# Patient Record
Sex: Female | Born: 2000 | Hispanic: Yes | Marital: Single | Smoking: Former smoker
Health system: Southern US, Community
[De-identification: ages and names within clinical notes are randomized; demographics above are authoritative.]

## PROBLEM LIST (undated history)

## (undated) DIAGNOSIS — Z789 Other specified health status: Secondary | ICD-10-CM

## (undated) DIAGNOSIS — I10 Essential (primary) hypertension: Secondary | ICD-10-CM

## (undated) DIAGNOSIS — O139 Gestational [pregnancy-induced] hypertension without significant proteinuria, unspecified trimester: Secondary | ICD-10-CM

## (undated) HISTORY — DX: Essential (primary) hypertension: I10

## (undated) HISTORY — PX: NO PAST SURGERIES: SHX2092

## (undated) HISTORY — DX: Gestational (pregnancy-induced) hypertension without significant proteinuria, unspecified trimester: O13.9

---
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
## 2020-02-12 ENCOUNTER — Inpatient Hospital Stay (HOSPITAL_COMMUNITY)
Admission: AD | Admit: 2020-02-12 | Discharge: 2020-02-12 | Disposition: A | Discharge status: Home / Self Care | Payer: Medicaid Other | Attending: Obstetrics and Gynecology | Admitting: Obstetrics and Gynecology

## 2020-02-12 ENCOUNTER — Other Ambulatory Visit: Payer: Self-pay

## 2020-02-12 ENCOUNTER — Encounter (HOSPITAL_COMMUNITY): Payer: Self-pay | Admitting: Obstetrics and Gynecology

## 2020-02-12 DIAGNOSIS — O26892 Other specified pregnancy related conditions, second trimester: Secondary | ICD-10-CM | POA: Insufficient documentation

## 2020-02-12 DIAGNOSIS — O209 Hemorrhage in early pregnancy, unspecified: Secondary | ICD-10-CM | POA: Insufficient documentation

## 2020-02-12 DIAGNOSIS — R109 Unspecified abdominal pain: Secondary | ICD-10-CM | POA: Diagnosis not present

## 2020-02-12 DIAGNOSIS — Z79899 Other long term (current) drug therapy: Secondary | ICD-10-CM | POA: Insufficient documentation

## 2020-02-12 DIAGNOSIS — Z87891 Personal history of nicotine dependence: Secondary | ICD-10-CM | POA: Insufficient documentation

## 2020-02-12 DIAGNOSIS — Z3A18 18 weeks gestation of pregnancy: Secondary | ICD-10-CM | POA: Insufficient documentation

## 2020-02-12 DIAGNOSIS — Z791 Long term (current) use of non-steroidal anti-inflammatories (NSAID): Secondary | ICD-10-CM | POA: Insufficient documentation

## 2020-02-12 DIAGNOSIS — O26899 Other specified pregnancy related conditions, unspecified trimester: Secondary | ICD-10-CM

## 2020-02-12 LAB — WET PREP, GENITAL
Clue Cells Wet Prep HPF POC: NONE SEEN
Sperm: NONE SEEN
Trich, Wet Prep: NONE SEEN
Yeast Wet Prep HPF POC: NONE SEEN

## 2020-02-12 LAB — URINALYSIS, ROUTINE W REFLEX MICROSCOPIC
Bilirubin Urine: NEGATIVE
Glucose, UA: NEGATIVE mg/dL
Hgb urine dipstick: NEGATIVE
Ketones, ur: NEGATIVE mg/dL
Leukocytes,Ua: NEGATIVE
Nitrite: NEGATIVE
Protein, ur: NEGATIVE mg/dL
Specific Gravity, Urine: 1.014 (ref 1.005–1.030)
pH: 7 (ref 5.0–8.0)

## 2020-02-12 NOTE — MAU Note (Signed)
Theresa Tran is a 19 y.o. at [redacted]w[redacted]d here in MAU reporting:  +lower abdominal pain Sharp Intermittent Worse at night  +vaginal bleeding Spotting Light pink  Endorses that " I haven't been seen since 10 weeks. I don't know the gender of the baby, whats going on with the baby or anything like that."  Reports that she has had a hard time getting accepted for a prenatal appointment.   Onset of complaint: x1 week Pain score: 4/10 Vitals:   02/12/20 1737  BP: 128/73  Pulse: 94  Resp: 18  Temp: 98.1 F (36.7 C)  SpO2: 99%    FHT: 146 via doppler Lab orders placed from triage: ua

## 2020-02-12 NOTE — MAU Provider Note (Signed)
Chief Complaint: Abdominal Pain and Vaginal Bleeding   First Provider Initiated Contact with Patient 02/12/20 1750    SUBJECTIVE HPI: Theresa Tran is a 19 y.o. G2P1001 at [redacted]w[redacted]d by L/10 who presents to maternity admissions reporting concern of mild abdominal cramping in addition to mild spotting last week. Pt reports pain as intermittent and rated 5/10. Pt reports that pain is more frequent at night. She has not tried any medication for the pain. No medications besides prenatal vitamin. No substances. Prior history uncomplicated except for gHTN. No BP cuff at home.  She states that she is here today given that she has been unable to schedule a f/u prenatal appointment [facility] [address] (she was told no appointments available until 03/2020).  She denies vaginal bleeding, vaginal itching/burning, urinary symptoms, h/a, dizziness, n/v, constipation, diarrhea, or fever/chills.  Past Medical History:  Diagnosis Date  . Medical history non-contributory    History reviewed. No pertinent surgical history. Social History   Socioeconomic History  . Marital status: Single    Spouse name: Not on file  . Number of children: Not on file  . Years of education: Not on file  . Highest education level: Not on file  Occupational History  . Not on file  Tobacco Use  . Smoking status: Former Smoker    Quit date: 07/10/2016    Years since quitting: 3.5  . Smokeless tobacco: Never Used  Substance and Sexual Activity  . Alcohol use: No  . Drug use: No  . Sexual activity: Yes  Other Topics Concern  . Not on file  Social History Narrative  . Not on file   Social Determinants of Health   Financial Resource Strain:   . Difficulty of Paying Living Expenses: Not on file  Food Insecurity: Food Insecurity Present  . Worried About Programme researcher, broadcasting/film/video in the Last Year: Sometimes true  . Ran Out of Food in the Last Year: Sometimes true  Transportation Needs: No  Transportation Needs  . Lack of Transportation (Medical): No  . Lack of Transportation (Non-Medical): No  Physical Activity:   . Days of Exercise per Week: Not on file  . Minutes of Exercise per Session: Not on file  Stress:   . Feeling of Stress : Not on file  Social Connections:   . Frequency of Communication with Friends and Family: Not on file  . Frequency of Social Gatherings with Friends and Family: Not on file  . Attends Religious Services: Not on file  . Active Member of Clubs or Organizations: Not on file  . Attends Banker Meetings: Not on file  . Marital Status: Not on file  Intimate Partner Violence:   . Fear of Current or Ex-Partner: Not on file  . Emotionally Abused: Not on file  . Physically Abused: Not on file  . Sexually Abused: Not on file   No current facility-administered medications on file prior to encounter.   Current Outpatient Medications on File Prior to Encounter  Medication Sig Dispense Refill  . Prenatal Vit-Fe Fumarate-FA (PRENATAL MULTIVITAMIN) TABS tablet Take 1 tablet by mouth daily at 12 noon.     No Known Allergies  ROS:  Review of Systems  Constitutional: Negative for appetite change, chills, fatigue and fever.  HENT: Negative for congestion and sore throat.   Eyes: Negative for photophobia and visual disturbance.  Respiratory: Negative for cough, chest tightness and shortness of breath.   Cardiovascular: Negative for chest  pain.  Gastrointestinal: Negative for abdominal distention, abdominal pain, constipation, diarrhea, nausea and vomiting.       Intermittent abdominal cramping  Endocrine: Negative for polyuria.  Genitourinary: Negative for decreased urine volume, difficulty urinating, dyspareunia, dysuria, flank pain, frequency, vaginal bleeding, vaginal discharge and vaginal pain.  Musculoskeletal: Negative for arthralgias and back pain.  Neurological: Negative for dizziness, syncope, weakness and light-headedness.    I  have reviewed patient's Past Medical Hx, Surgical Hx, Family Hx, Social Hx, medications and allergies.   Physical Exam   Patient Vitals for the past 24 hrs:  BP Temp Temp src Pulse Resp SpO2  02/12/20 1920 118/60 -- -- 76 18 --  02/12/20 1737 128/73 98.1 F (36.7 C) Oral 94 18 99 %   Constitutional: Well-developed, well-nourished female in no acute distress. Lying comfortably on stretcher. Cardiovascular: RRR, no murmurs, rubs or gallops. Respiratory: normal effort. CTAB in anterior lung fields. GI: Abd soft, non-tender to palpation. Pos BS x 4. No CVA tenderness. MS: Extremities nontender, no edema, normal ROM Neurologic: Alert and oriented x 4.  GU: deferred  FHT 146 by doppler  LAB RESULTS Results for orders placed or performed during the hospital encounter of 02/12/20 (from the past 24 hour(s))  Urinalysis, Routine w reflex microscopic Urine, Clean Catch     Status: Abnormal   Collection Time: 02/12/20  5:57 PM  Result Value Ref Range   Color, Urine YELLOW YELLOW   APPearance CLOUDY (A) CLEAR   Specific Gravity, Urine 1.014 1.005 - 1.030   pH 7.0 5.0 - 8.0   Glucose, UA NEGATIVE NEGATIVE mg/dL   Hgb urine dipstick NEGATIVE NEGATIVE   Bilirubin Urine NEGATIVE NEGATIVE   Ketones, ur NEGATIVE NEGATIVE mg/dL   Protein, ur NEGATIVE NEGATIVE mg/dL   Nitrite NEGATIVE NEGATIVE   Leukocytes,Ua NEGATIVE NEGATIVE  Wet prep, genital     Status: Abnormal   Collection Time: 02/12/20  6:55 PM  Result Value Ref Range   Yeast Wet Prep HPF POC NONE SEEN NONE SEEN   Trich, Wet Prep NONE SEEN NONE SEEN   Clue Cells Wet Prep HPF POC NONE SEEN NONE SEEN   WBC, Wet Prep HPF POC FEW (A) NONE SEEN   Sperm NONE SEEN     IMAGING No results found.  MAU Management/MDM: Orders Placed This Encounter  Procedures  . Wet prep, genital  . [place] MFM OB COMP + 14 WK  . Urinalysis, Routine w reflex microscopic Urine, Clean Catch  . Discharge patient Discharge disposition: 01-Home or Self Care;  Discharge patient date: 02/12/2020 Pt may discharge s/p self collect of wet prep & GC/C (pt prefers not to wait for wet prep result & Adelyna Brockman will call pt if abnormal).    No orders of the defined types were placed in this encounter.   Treatments in MAU included: none. Pt discharged with strict precautions to call prior to next prenatal appointment for vaginal bleeding, severe abdominal pain, loss of fluid from the vagina, contractions and other concerns.  ASSESSMENT 1. Abdominal cramping affecting pregnancy: Pt is an 19 year old G2P1001 at [redacted]w[redacted]d based on L/10 who presents given concern of intermittent abdominal cramping for 1 week in addition to minimal pink spotting 1 week ago. Ultimately she presents to MAU today given inability to schedule a prenatal appt at [facility] [address]. Reassuringly, pt has a confirmed IUP based on [place] in MAU at [redacted]w[redacted]d. Today, no concerning symptoms and reassuring exam. UA and wet prep unremarkable (GC/C pending)  with +FHTs. -provided reassurance given exam and lab findings today -emphasized need for continued daily prenatal vitamin + ASA given h/o gHTN -sent message to clinic admin pool to schedule pt for prompt prenatal f/u appt -placed future order for anatomy scan to be performed in clinic -provided strict return precautions for signs of preterm labor as noted above   PLAN Discharge home Allergies as of 02/12/2020   No Known Allergies     Medication List    STOP taking these medications   amLODipine 5 MG tablet Commonly known as: Norvasc   ibuprofen 600 MG tablet Commonly known as: ADVIL     TAKE these medications   prenatal multivitamin Tabs tablet Take 1 tablet by mouth daily at 12 noon.      Lynnda Shields, MD OB Fellow, Faculty Practice 02/12/2020  9:38 PM

## 2020-02-12 NOTE — Discharge Instructions (Signed)
You were seen today given concern of abdominal cramping in pregnancy. No sign of infection in your urine. Given your ultrasound with confirmed intrauterine pregnancy at [redacted] weeks gestational age, we are not concerned of an ectopic pregnancy at this time.  We have sent a message to the [facility] on [address] to schedule your initial prenatal appointment. I have also placed an order for your anatomy scan ultrasound--you should receive a call to schedule this appointment.  Please call sooner if concern of vaginal bleeding, severe abdominal pain or other concerns.   Abdominal Pain During Pregnancy  Abdominal pain is common during pregnancy, and has many possible causes. Some causes are more serious than others, and sometimes the cause is not known. Abdominal pain can be a sign that labor is starting. It can also be caused by normal growth and stretching of muscles and ligaments during pregnancy. Always tell your health care provider if you have any abdominal pain. Follow these instructions at home:  Do not have sex or put anything in your vagina until your pain goes away completely.  Get plenty of rest until your pain improves.  Drink enough fluid to keep your urine pale yellow.  Take over-the-counter and prescription medicines only as told by your health care provider.  Keep all follow-up visits as told by your health care provider. This is important. Contact a health care provider if:  Your pain continues or gets worse after resting.  You have lower abdominal pain that: ? Comes and goes at regular intervals. ? Spreads to your back. ? Is similar to menstrual cramps.  You have pain or burning when you urinate. Get help right away if:  You have a fever or chills.  You have vaginal bleeding.  You are leaking fluid from your vagina.  You are passing tissue from your vagina.  You have vomiting or diarrhea that lasts for more than 24 hours.  Your baby is moving less  than usual.  You feel very weak or faint.  You have shortness of breath.  You develop severe pain in your upper abdomen. Summary  Abdominal pain is common during pregnancy, and has many possible causes.  If you experience abdominal pain during pregnancy, tell your health care provider right away.  Follow your health care provider's home care instructions and keep all follow-up visits as directed. This information is not intended to replace advice given to you by your health care provider. Make sure you discuss any questions you have with your health care provider. Document Revised: 09/13/2018 Document Reviewed: 08/28/2016 Elsevier Patient Education  2020 [employer].

## 2020-02-14 LAB — GC/CHLAMYDIA PROBE AMP (~~LOC~~) NOT AT ARMC
Chlamydia: NEGATIVE
Comment: NEGATIVE
Comment: NORMAL
Neisseria Gonorrhea: NEGATIVE

## 2020-02-16 ENCOUNTER — Telehealth: Payer: Self-pay | Admitting: Family Medicine

## 2020-02-16 NOTE — Telephone Encounter (Signed)
Attempted to contact patient to get her scheduled for prenatal care. A gentleman answered the phone and stated that he is currently at work and he will let the patient know to call the office back.

## 2020-03-06 ENCOUNTER — Encounter: Payer: Self-pay | Admitting: Nurse Practitioner

## 2020-03-06 ENCOUNTER — Other Ambulatory Visit: Payer: Self-pay

## 2020-03-06 ENCOUNTER — Ambulatory Visit (INDEPENDENT_AMBULATORY_CARE_PROVIDER_SITE_OTHER): Payer: Medicaid Other | Admitting: Nurse Practitioner

## 2020-03-06 ENCOUNTER — Encounter: Payer: Self-pay | Admitting: Family Medicine

## 2020-03-06 VITALS — BP 116/70 | HR 82 | Wt 259.8 lb

## 2020-03-06 DIAGNOSIS — Z3A22 22 weeks gestation of pregnancy: Secondary | ICD-10-CM

## 2020-03-06 DIAGNOSIS — O0990 Supervision of high risk pregnancy, unspecified, unspecified trimester: Secondary | ICD-10-CM

## 2020-03-06 DIAGNOSIS — Z6841 Body Mass Index (BMI) 40.0 and over, adult: Secondary | ICD-10-CM

## 2020-03-06 DIAGNOSIS — Z8759 Personal history of other complications of pregnancy, childbirth and the puerperium: Secondary | ICD-10-CM

## 2020-03-06 HISTORY — DX: Supervision of high risk pregnancy, unspecified, unspecified trimester: O09.90

## 2020-03-06 NOTE — Progress Notes (Unsigned)
Medicaid Home Form Completed-03/06/20

## 2020-03-06 NOTE — Progress Notes (Signed)
Subjective:   Theresa Tran is a 19 y.o. G2P1001 at [redacted]w[redacted]d by early ultrasound being seen today for her first obstetrical visit.  Her obstetrical history is significant for hypertension with previous pregnancy. Patient does intend to breast feed. Pregnancy history fully reviewed.  Patient reports no complaints.  HISTORY: OB History  Gravida Para Term Preterm AB Living  2 1 1  0 0 1  SAB TAB Ectopic Multiple Live Births  0 0 0 0 1    # Outcome Date GA Lbr Len/2nd Weight Sex Delivery Anes PTL Lv  2 Current           1 Term 07/19/17 [redacted]w[redacted]d 12:19 / 02:01 8 lb 5.7 oz (3.79 kg) F Vag-Spont EPI  LIV     Name: MACIAS-VELEZ,GIRL Emmali     Apgar1: 8  Apgar5: 9   Past Medical History:  Diagnosis Date  . Hypertension    Past Surgical History:  Procedure Laterality Date  . NO PAST SURGERIES     History reviewed. No pertinent family history. Social History   Tobacco Use  . Smoking status: Former Smoker    Quit date: 07/10/2016    Years since quitting: 3.6  . Smokeless tobacco: Never Used  Substance Use Topics  . Alcohol use: No  . Drug use: No   No Known Allergies Current Outpatient Medications on File Prior to Visit  Medication Sig Dispense Refill  . Prenatal Vit-Fe Fumarate-FA (PRENATAL MULTIVITAMIN) TABS tablet Take 1 tablet by mouth daily at 12 noon.     No current facility-administered medications on file prior to visit.     Exam   Vitals:   03/06/20 1024  BP: 116/70  Pulse: 82  Weight: 259 lb 12.8 oz (117.8 kg)   Fetal Heart Rate (bpm): 150  Uterus:  Fundal Height: 23 cm  Pelvic Exam: Perineum: no hemorrhoids, normal perineum   Vulva: normal external genitalia, no lesions   Vagina:  normal mucosa, normal discharge   Cervix: no lesions and normal, pap smear done.    Adnexa: normal adnexa and no mass, fullness, tenderness   Bony Pelvis: average  System: General: well-developed, well-nourished female in no acute distress   Breast:  normal appearance,  no masses or tenderness   Skin: normal coloration and turgor, no rashes   Neurologic: oriented, normal, negative, normal mood   Extremities: normal strength, tone, and muscle mass, ROM of all joints is normal   HEENT extraocular movement intact and sclera clear, anicteric   Mouth/Teeth deferred   Neck supple and no masses, normal thyroid   Cardiovascular: regular rate and rhythm   Respiratory:  no respiratory distress, normal breath sounds   Abdomen: soft, non-tender; no masses,  no organomegaly     Assessment:   Pregnancy: G2P1001 Patient Active Problem List   Diagnosis Date Noted  . Supervision of high risk pregnancy, antepartum 03/06/2020  . High risk teen pregnancy 07/19/2017  . History of gestational hypertension 07/18/2017     Plan:  1. Supervision of high risk pregnancy, antepartum Doing well. To see pregnancy navigator today Has 19 year old accompanying her today. Anatomy 2 ordered previously - will send note to admin staff to schedule  - CBC/D/Plt+RPR+Rh+ABO+Rub Ab... - CHL AMB BABYSCRIPTS OPT IN - Comprehensive metabolic panel - Culture, OB Urine - Protein / creatinine ratio, urine - TSH - Hemoglobin A1c - AFP, Serum, Open Spina Bifida - Genetic Screening  2. History of gestational hypertension Baseline labs done today.  3. [redacted]  weeks gestation of pregnancy  4. BMI 40.0-44.9, adult (HCC) Advised weight gain of 20 pounds in this pregnancy Drink at least 8 8-oz glasses of water every day. Stop juice, soda and sweet tea as they can cause extra weight gain.   Initial labs drawn. Continue prenatal vitamins. Genetic Screening discussed, AFP and NIPS: ordered. Ultrasound discussed; fetal anatomic survey: already ordered - will schedule. Problem list reviewed and updated. The nature of [place] [facility] with multiple MDs and other Advanced Practice Providers was explained to patient; also emphasized that residents, students are part  of our team. Routine obstetric precautions reviewed. Return in about 4 weeks (around 04/03/2020) for in person ROB.  Total face-to-face time with patient: 40 minutes.  Over 50% of encounter was spent on counseling and coordination of care.     Nolene Bernheim, FNP Family Nurse Practitioner, [facility] for [employer], [facility] 03/06/2020 2:24 PM

## 2020-03-07 LAB — COMPREHENSIVE METABOLIC PANEL
ALT: 18 IU/L (ref 0–32)
AST: 11 IU/L (ref 0–40)
Albumin/Globulin Ratio: 1.5 (ref 1.2–2.2)
Albumin: 4 g/dL (ref 3.9–5.0)
Alkaline Phosphatase: 119 IU/L — ABNORMAL HIGH (ref 42–106)
BUN/Creatinine Ratio: 11 (ref 9–23)
BUN: 6 mg/dL (ref 6–20)
Bilirubin Total: 0.3 mg/dL (ref 0.0–1.2)
CO2: 20 mmol/L (ref 20–29)
Calcium: 9.2 mg/dL (ref 8.7–10.2)
Chloride: 103 mmol/L (ref 96–106)
Creatinine, Ser: 0.54 mg/dL — ABNORMAL LOW (ref 0.57–1.00)
GFR calc Af Amer: 159 mL/min/{1.73_m2} (ref >59)
GFR calc non Af Amer: 138 mL/min/{1.73_m2} (ref >59)
Globulin, Total: 2.6 g/dL (ref 1.5–4.5)
Glucose: 75 mg/dL (ref 65–99)
Potassium: 4.2 mmol/L (ref 3.5–5.2)
Sodium: 137 mmol/L (ref 134–144)
Total Protein: 6.6 g/dL (ref 6.0–8.5)

## 2020-03-07 LAB — CBC/D/PLT+RPR+RH+ABO+RUB AB...
Antibody Screen: NEGATIVE
Basophils Absolute: 0.1 10*3/uL (ref 0.0–0.2)
Basos: 1 %
EOS (ABSOLUTE): 0.7 10*3/uL — ABNORMAL HIGH (ref 0.0–0.4)
Eos: 6 %
HCV Ab: <0.1 s/co ratio (ref 0.0–0.9)
HIV Screen 4th Generation wRfx: NONREACTIVE
Hematocrit: 38.1 % (ref 34.0–46.6)
Hemoglobin: 12.6 g/dL (ref 11.1–15.9)
Hepatitis B Surface Ag: NEGATIVE
Immature Grans (Abs): 0.1 10*3/uL (ref 0.0–0.1)
Immature Granulocytes: 1 %
Lymphocytes Absolute: 2.1 10*3/uL (ref 0.7–3.1)
Lymphs: 20 %
MCH: 28.4 pg (ref 26.6–33.0)
MCHC: 33.1 g/dL (ref 31.5–35.7)
MCV: 86 fL (ref 79–97)
Monocytes Absolute: 0.4 10*3/uL (ref 0.1–0.9)
Monocytes: 4 %
Neutrophils Absolute: 7 10*3/uL (ref 1.4–7.0)
Neutrophils: 68 %
Platelets: 302 10*3/uL (ref 150–450)
RBC: 4.44 x10E6/uL (ref 3.77–5.28)
RDW: 14 % (ref 11.7–15.4)
RPR Ser Ql: NONREACTIVE
Rh Factor: POSITIVE
Rubella Antibodies, IGG: 1.37 index (ref >0.99)
WBC: 10.3 10*3/uL (ref 3.4–10.8)

## 2020-03-07 LAB — AFP, SERUM, OPEN SPINA BIFIDA
AFP MoM: 0.71
AFP Value: 38.6 ng/mL
Gest. Age on Collection Date: 22 weeks
Maternal Age At EDD: 19.3 yr
OSBR Risk 1 IN: 10000
Test Results:: NEGATIVE
Weight: 259 [lb_av]

## 2020-03-07 LAB — TSH: TSH: 1.78 u[IU]/mL (ref 0.450–4.500)

## 2020-03-07 LAB — HEMOGLOBIN A1C
Est. average glucose Bld gHb Est-mCnc: 100 mg/dL
Hgb A1c MFr Bld: 5.1 % (ref 4.8–5.6)

## 2020-03-07 LAB — PROTEIN / CREATININE RATIO, URINE
Creatinine, Urine: 58.8 mg/dL
Protein, Ur: 4.7 mg/dL
Protein/Creat Ratio: 80 mg/g creat (ref 0–200)

## 2020-03-07 LAB — HCV INTERPRETATION

## 2020-03-08 LAB — URINE CULTURE, OB REFLEX

## 2020-03-08 LAB — CULTURE, OB URINE

## 2020-03-16 ENCOUNTER — Other Ambulatory Visit (HOSPITAL_COMMUNITY): Payer: Self-pay | Admitting: Obstetrics and Gynecology

## 2020-03-16 ENCOUNTER — Other Ambulatory Visit: Payer: Self-pay

## 2020-03-16 ENCOUNTER — Ambulatory Visit: Payer: Medicaid Other | Attending: Obstetrics and Gynecology

## 2020-03-16 DIAGNOSIS — O26899 Other specified pregnancy related conditions, unspecified trimester: Secondary | ICD-10-CM | POA: Diagnosis present

## 2020-03-16 DIAGNOSIS — O99212 Obesity complicating pregnancy, second trimester: Secondary | ICD-10-CM

## 2020-03-16 DIAGNOSIS — R109 Unspecified abdominal pain: Secondary | ICD-10-CM | POA: Insufficient documentation

## 2020-03-16 DIAGNOSIS — Z3A23 23 weeks gestation of pregnancy: Secondary | ICD-10-CM

## 2020-03-16 DIAGNOSIS — E669 Obesity, unspecified: Secondary | ICD-10-CM | POA: Diagnosis not present

## 2020-03-16 DIAGNOSIS — Z363 Encounter for antenatal screening for malformations: Secondary | ICD-10-CM | POA: Diagnosis not present

## 2020-03-19 ENCOUNTER — Other Ambulatory Visit: Payer: Self-pay | Admitting: *Deleted

## 2020-03-19 DIAGNOSIS — Z362 Encounter for other antenatal screening follow-up: Secondary | ICD-10-CM

## 2020-03-25 ENCOUNTER — Other Ambulatory Visit: Payer: Self-pay

## 2020-03-25 DIAGNOSIS — Z148 Genetic carrier of other disease: Secondary | ICD-10-CM

## 2020-03-25 DIAGNOSIS — O0990 Supervision of high risk pregnancy, unspecified, unspecified trimester: Secondary | ICD-10-CM

## 2020-03-26 ENCOUNTER — Telehealth: Payer: Self-pay

## 2020-03-26 NOTE — Telephone Encounter (Signed)
Called Pt to see if Theresa Tran had reached out to her regarding Horizon Test results & she said Yes but still was not quit sure what it is. So I explained that she carries a gene called Galactosemia & that affects how the body breaks down a sugar called Galactose. Pt verbalized understanding.

## 2020-04-03 ENCOUNTER — Ambulatory Visit (INDEPENDENT_AMBULATORY_CARE_PROVIDER_SITE_OTHER): Payer: Medicaid Other | Admitting: Student

## 2020-04-03 ENCOUNTER — Other Ambulatory Visit: Payer: Self-pay

## 2020-04-03 VITALS — BP 119/69 | HR 87 | Wt 264.0 lb

## 2020-04-03 DIAGNOSIS — Z3A26 26 weeks gestation of pregnancy: Secondary | ICD-10-CM

## 2020-04-03 DIAGNOSIS — O0990 Supervision of high risk pregnancy, unspecified, unspecified trimester: Secondary | ICD-10-CM

## 2020-04-03 NOTE — Patient Instructions (Addendum)
[address] [place], [place], [address]. Phone. [phone].  Blood pressure warning levels are anytime the levels are higher than 140/90. If two high blood pressure readings, come to the maternity assessment unit.

## 2020-04-03 NOTE — Progress Notes (Signed)
   PRENATAL VISIT NOTE  Subjective:  Theresa Tran is a 19 y.o. G2P1001 at [redacted]w[redacted]d being seen today for ongoing prenatal care.  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  Patient reports no complaints.  Her partner has not been tested, although he "wants to". She has not picked up her BP cuff.  Contractions: Not present. Vag. Bleeding: None.  Movement: Present. Denies leaking of fluid.   The following portions of the patient's history were reviewed and updated as appropriate: allergies, current medications, past family history, past medical history, past social history, past surgical history and problem list.   Objective:   Vitals:   04/03/20 1016  BP: 119/69  Pulse: 87  Weight: 264 lb (119.7 kg)    Fetal Status: Fetal Heart Rate (bpm): 152 Fundal Height: 26 cm Movement: Present     General:  Alert, oriented and cooperative. Patient is in no acute distress.  Skin: Skin is warm and dry. No rash noted.   Cardiovascular: Normal heart rate noted  Respiratory: Normal respiratory effort, no problems with respiration noted  Abdomen: Soft, gravid, appropriate for gestational age.  Pain/Pressure: Absent     Pelvic: Cervical exam deferred        Extremities: Normal range of motion.  Edema: None  Mental Status: Normal mood and affect. Normal behavior. Normal judgment and thought content.   Assessment and Plan:  Pregnancy: G2P1001 at [redacted]w[redacted]d 1. Supervision of high risk pregnancy, antepartum -SHe will call to have [employer] kit sent to house for genetic testing -She does not want IUD, wants to think about it, maybe Nexplanon. She says "I know I need to get on something".  -Keep follow up US for growth -Anticipatory guidance given for next OB appt for GTT -Patient will  go and get BP cuff, address given for [place].   Preterm labor symptoms and general obstetric precautions including but not limited to vaginal bleeding, contractions, leaking of fluid and fetal movement were reviewed in detail with the patient. Please refer to After Visit Summary for other counseling recommendations.   Return in about 2 weeks (around 04/17/2020), or LROB needs 2 hour gtt.  Future Appointments  Date Time Provider [place]  04/13/2020  2:30 PM [facility] NURSE [facility]  04/13/2020  2:45 PM [facility] US5 [facility]  04/18/2020  8:20 AM [facility] LAB [facility]  04/18/2020  9:15 AM Ardean Larsen, Mervyn Skeeters, CNM [facility]    [place], CNM

## 2020-04-11 ENCOUNTER — Encounter: Payer: Self-pay | Admitting: *Deleted

## 2020-04-13 ENCOUNTER — Ambulatory Visit: Payer: Medicaid Other | Attending: Obstetrics and Gynecology

## 2020-04-13 ENCOUNTER — Other Ambulatory Visit: Payer: Self-pay

## 2020-04-13 ENCOUNTER — Ambulatory Visit: Payer: Medicaid Other | Admitting: *Deleted

## 2020-04-13 ENCOUNTER — Encounter: Payer: Self-pay | Admitting: *Deleted

## 2020-04-13 DIAGNOSIS — O99212 Obesity complicating pregnancy, second trimester: Secondary | ICD-10-CM

## 2020-04-13 DIAGNOSIS — Z148 Genetic carrier of other disease: Secondary | ICD-10-CM

## 2020-04-13 DIAGNOSIS — O0990 Supervision of high risk pregnancy, unspecified, unspecified trimester: Secondary | ICD-10-CM | POA: Insufficient documentation

## 2020-04-13 DIAGNOSIS — E669 Obesity, unspecified: Secondary | ICD-10-CM

## 2020-04-13 DIAGNOSIS — Z362 Encounter for other antenatal screening follow-up: Secondary | ICD-10-CM | POA: Insufficient documentation

## 2020-04-13 DIAGNOSIS — Z3A27 27 weeks gestation of pregnancy: Secondary | ICD-10-CM

## 2020-04-13 DIAGNOSIS — O321XX0 Maternal care for breech presentation, not applicable or unspecified: Secondary | ICD-10-CM

## 2020-04-17 ENCOUNTER — Other Ambulatory Visit: Payer: Self-pay

## 2020-04-17 DIAGNOSIS — O0990 Supervision of high risk pregnancy, unspecified, unspecified trimester: Secondary | ICD-10-CM

## 2020-04-18 ENCOUNTER — Other Ambulatory Visit: Payer: Self-pay

## 2020-04-18 ENCOUNTER — Other Ambulatory Visit: Payer: Medicaid Other

## 2020-04-18 ENCOUNTER — Other Ambulatory Visit: Payer: Self-pay | Admitting: *Deleted

## 2020-04-18 ENCOUNTER — Ambulatory Visit (INDEPENDENT_AMBULATORY_CARE_PROVIDER_SITE_OTHER): Payer: Medicaid Other | Admitting: Student

## 2020-04-18 VITALS — BP 107/66 | HR 77 | Wt 263.2 lb

## 2020-04-18 DIAGNOSIS — O0992 Supervision of high risk pregnancy, unspecified, second trimester: Secondary | ICD-10-CM

## 2020-04-18 DIAGNOSIS — Z23 Encounter for immunization: Secondary | ICD-10-CM | POA: Diagnosis not present

## 2020-04-18 DIAGNOSIS — Z3A28 28 weeks gestation of pregnancy: Secondary | ICD-10-CM

## 2020-04-18 DIAGNOSIS — R638 Other symptoms and signs concerning food and fluid intake: Secondary | ICD-10-CM

## 2020-04-18 DIAGNOSIS — O0990 Supervision of high risk pregnancy, unspecified, unspecified trimester: Secondary | ICD-10-CM

## 2020-04-18 MED ORDER — BLOOD PRESSURE KIT DEVI: 1.0000 | Freq: Once | 0 refills | Status: AC

## 2020-04-18 NOTE — Progress Notes (Signed)
   PRENATAL VISIT NOTE  Subjective:  Theresa Tran is a 19 y.o. G2P1001 at [redacted]w[redacted]d being seen today for ongoing prenatal care.  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  Patient reports no complaints.  Contractions: Not present. Vag. Bleeding: None.  Movement: Present. Denies leaking of fluid.   The following portions of the patient's history were reviewed and updated as appropriate: allergies, current medications, past family history, past medical history, past social history, past surgical history and problem list.   Objective:   Vitals:   04/18/20 0828  BP: 107/66  Pulse: 77  Weight: 263 lb 3.2 oz (119.4 kg)    Fetal Status: Fetal Heart Rate (bpm): 132 Fundal Height: 31 cm Movement: Present     General:  Alert, oriented and cooperative. Patient is in no acute distress.  Skin: Skin is warm and dry. No rash noted.   Cardiovascular: Normal heart rate noted  Respiratory: Normal respiratory effort, no problems with respiration noted  Abdomen: Soft, gravid, appropriate for gestational age.  Pain/Pressure: Absent     Pelvic: Cervical exam deferred        Extremities: Normal range of motion.  Edema: None  Mental Status: Normal mood and affect. Normal behavior. Normal judgment and thought content.   Assessment and Plan:  Pregnancy: G2P1001 at [redacted]w[redacted]d 1. Supervision of high risk pregnancy, antepartum -Will go and get BP cuff; she forgot to pick up last time.  -She will call and get the kit sent for partner testing -She had normal growth scan and needs 4 week scan; she forgot to schedule it last time and will go up to MFM now to schedule it.   -2 hour in process - Blood Pressure Monitoring (BLOOD PRESSURE KIT) DEVI; 1 Device by Does not apply route once for 1  dose.  Dispense: 1 each; Refill: 0 - Tdap vaccine greater than or equal to 7yo IM  Preterm labor symptoms and general obstetric precautions including but not limited to vaginal bleeding, contractions, leaking of fluid and fetal movement were reviewed in detail with the patient. Please refer to After Visit Summary for other counseling recommendations.   Return in about 2 weeks (around 05/02/2020), or 2 weeks for inperson.  Future Appointments  Date Time Provider [place]  05/07/2020  8:30 AM [facility] US3 [facility] [place], CNM

## 2020-04-18 NOTE — Progress Notes (Deleted)
t

## 2020-04-19 LAB — CBC
Hematocrit: 38.8 % (ref 34.0–46.6)
Hemoglobin: 12.6 g/dL (ref 11.1–15.9)
MCH: 28.1 pg (ref 26.6–33.0)
MCHC: 32.5 g/dL (ref 31.5–35.7)
MCV: 86 fL (ref 79–97)
Platelets: 302 10*3/uL (ref 150–450)
RBC: 4.49 x10E6/uL (ref 3.77–5.28)
RDW: 12.8 % (ref 11.7–15.4)
WBC: 10 10*3/uL (ref 3.4–10.8)

## 2020-04-19 LAB — HIV ANTIBODY (ROUTINE TESTING W REFLEX): HIV Screen 4th Generation wRfx: NONREACTIVE

## 2020-04-19 LAB — RPR: RPR Ser Ql: NONREACTIVE

## 2020-04-19 LAB — GLUCOSE TOLERANCE, 2 HOURS W/ 1HR
Glucose, 1 hour: 140 mg/dL (ref 65–179)
Glucose, 2 hour: 112 mg/dL (ref 65–152)
Glucose, Fasting: 77 mg/dL (ref 65–91)

## 2020-05-02 ENCOUNTER — Encounter: Payer: Medicaid Other | Admitting: Family Medicine

## 2020-05-07 ENCOUNTER — Other Ambulatory Visit: Payer: Self-pay | Admitting: *Deleted

## 2020-05-07 ENCOUNTER — Ambulatory Visit: Payer: Medicaid Other | Admitting: *Deleted

## 2020-05-07 ENCOUNTER — Ambulatory Visit: Payer: Medicaid Other | Attending: Obstetrics and Gynecology

## 2020-05-07 ENCOUNTER — Encounter: Payer: Medicaid Other | Admitting: Advanced Practice Midwife

## 2020-05-07 ENCOUNTER — Encounter: Payer: Self-pay | Admitting: *Deleted

## 2020-05-07 ENCOUNTER — Other Ambulatory Visit: Payer: Self-pay

## 2020-05-07 DIAGNOSIS — R638 Other symptoms and signs concerning food and fluid intake: Secondary | ICD-10-CM | POA: Diagnosis not present

## 2020-05-07 DIAGNOSIS — Z362 Encounter for other antenatal screening follow-up: Secondary | ICD-10-CM

## 2020-05-07 DIAGNOSIS — E669 Obesity, unspecified: Secondary | ICD-10-CM

## 2020-05-07 DIAGNOSIS — O99213 Obesity complicating pregnancy, third trimester: Secondary | ICD-10-CM | POA: Diagnosis not present

## 2020-05-07 DIAGNOSIS — O0990 Supervision of high risk pregnancy, unspecified, unspecified trimester: Secondary | ICD-10-CM | POA: Diagnosis present

## 2020-05-07 DIAGNOSIS — Z148 Genetic carrier of other disease: Secondary | ICD-10-CM | POA: Diagnosis not present

## 2020-05-07 DIAGNOSIS — Z3A30 30 weeks gestation of pregnancy: Secondary | ICD-10-CM

## 2020-05-17 ENCOUNTER — Encounter: Payer: Self-pay | Admitting: Student

## 2020-05-17 ENCOUNTER — Encounter: Payer: Medicaid Other | Admitting: Student

## 2020-06-04 ENCOUNTER — Other Ambulatory Visit: Payer: Self-pay

## 2020-06-04 ENCOUNTER — Encounter: Payer: Self-pay | Admitting: *Deleted

## 2020-06-04 ENCOUNTER — Other Ambulatory Visit: Payer: Self-pay | Admitting: *Deleted

## 2020-06-04 ENCOUNTER — Ambulatory Visit: Payer: Medicaid Other | Admitting: *Deleted

## 2020-06-04 ENCOUNTER — Ambulatory Visit: Payer: Medicaid Other | Attending: Obstetrics and Gynecology

## 2020-06-04 DIAGNOSIS — Z6841 Body Mass Index (BMI) 40.0 and over, adult: Secondary | ICD-10-CM

## 2020-06-04 DIAGNOSIS — O99213 Obesity complicating pregnancy, third trimester: Secondary | ICD-10-CM | POA: Diagnosis not present

## 2020-06-04 DIAGNOSIS — O0990 Supervision of high risk pregnancy, unspecified, unspecified trimester: Secondary | ICD-10-CM | POA: Diagnosis present

## 2020-06-04 DIAGNOSIS — Z148 Genetic carrier of other disease: Secondary | ICD-10-CM

## 2020-06-04 DIAGNOSIS — Z3A34 34 weeks gestation of pregnancy: Secondary | ICD-10-CM | POA: Diagnosis not present

## 2020-06-09 NOTE — L&D Delivery Note (Addendum)
OB/GYN Faculty Practice Delivery Note  Theresa Tran is a 20 y.o. G2P1001 at [redacted]w[redacted]d. She was admitted for IOL d/t gHTN.   ROM: 12h 26m with clear fluid GBS Status: neg Maximum Maternal Temperature: 99.6*F  Labor Progress: . Patient started induction with cytotec x1 and FB. She then transitioned to pitocin early on 07/06/20 and had AROM in the early afternoon. She then progressed to complete.  Delivery Date/Time: Jaunary 29, 2022 at 0058 Delivery: Called to room and patient was complete and pushing. Head delivered LOA. Nuchal cord present x1, delivered through. Shoulder and body delivered in usual fashion. Infant with spontaneous cry, placed on mother's abdomen, dried and stimulated. Cord clamped x 2 after 1-minute delay, and cut by MOB. Cord blood drawn. Placenta delivered spontaneously with gentle cord traction. Fundus firm with massage and Pitocin. Labia, perineum, vagina, and cervix inspected with no lacerations. Pitocin started for active management of third stage of labor, uterus was boggy but improved with massage. Patient received cytotec 800 mg PR and TXA 1g IV with resolution of bleeding and improved tone of uterus.  Placenta: Intact, Spontaneous, 3VC. Complications: none Lacerations: none EBL: 300cc Analgesia: epidural  Infant: viable female  APGARs 8,9  weight pending per medical chart  to newborn care, skin-to-skin  Shirlean Mylar, MD [facility] Family Medicine, PGY-2 07/07/2020, 1:19 AM   I was present and gloved for entirety of delivery. I agree with the findings and the plan of care as documented in the resident's note.  Casper Harrison, MD [facility] Family Medicine Fellow, [facility] for [facility], [facility]

## 2020-06-13 ENCOUNTER — Other Ambulatory Visit: Payer: Self-pay

## 2020-06-13 ENCOUNTER — Other Ambulatory Visit (HOSPITAL_COMMUNITY)
Admission: RE | Admit: 2020-06-13 | Discharge: 2020-06-13 | Disposition: A | Discharge status: Home / Self Care | Payer: Medicaid Other | Source: Ambulatory Visit | Attending: Certified Nurse Midwife | Admitting: Certified Nurse Midwife

## 2020-06-13 ENCOUNTER — Ambulatory Visit (INDEPENDENT_AMBULATORY_CARE_PROVIDER_SITE_OTHER): Payer: Medicaid Other | Admitting: Certified Nurse Midwife

## 2020-06-13 VITALS — BP 127/80 | HR 101 | Wt 269.1 lb

## 2020-06-13 DIAGNOSIS — O0990 Supervision of high risk pregnancy, unspecified, unspecified trimester: Secondary | ICD-10-CM

## 2020-06-13 DIAGNOSIS — Z3A36 36 weeks gestation of pregnancy: Secondary | ICD-10-CM

## 2020-06-13 NOTE — Progress Notes (Signed)
   PRENATAL VISIT NOTE  Subjective:  Theresa Tran is a 20 y.o. G2P1001 at [redacted]w[redacted]d being seen today for ongoing prenatal care.  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  Patient reports backache.  Contractions: Not present. Vag. Bleeding: None.  Movement: Present. Denies leaking of fluid.   The following portions of the patient's history were reviewed and updated as appropriate: allergies, current medications, past family history, past medical history, past social history, past surgical history and problem list.   Objective:   Vitals:   06/13/20 1614  BP: 127/80  Pulse: (!) 101  Weight: 269 lb 1.6 oz (122.1 kg)    Fetal Status: Fetal Heart Rate (bpm): 135 Fundal Height: 37 cm Movement: Present     General:  Alert, oriented and cooperative. Patient is in no acute distress.  Skin: Skin is warm and dry. No rash noted.   Cardiovascular: Normal heart rate noted  Respiratory: Normal respiratory effort, no problems with respiration noted  Abdomen: Soft, gravid, appropriate for gestational age.  Pain/Pressure: Absent     Pelvic: Cervical exam deferred        Extremities: Normal range of motion.  Edema: None  Mental Status: Normal mood and affect. Normal behavior. Normal judgment and thought content.   Assessment and Plan:  Pregnancy: G2P1001 at [redacted]w[redacted]d 1. Supervision of high risk pregnancy, antepartum - Pt doing well aside from mild back pain, reviewed stretches/movements to ease.  - Pt had questions about size of baby, does feel large subjectively but pt has follow-up U/S on 06/19/19  2. [redacted] weeks gestation of pregnancy - Culture, beta strep (group b only) - GC/Chlamydia probe amp ([place])not at [facility]  Preterm labor symptoms and general obstetric  precautions including but not limited to vaginal bleeding, contractions, leaking of fluid and fetal movement were reviewed in detail with the patient. Please refer to After Visit Summary for other counseling recommendations.   Return in about 1 week (around 06/20/2020) for IN-PERSON, LOB.  Future Appointments  Date Time Provider Department Center  06/18/2020  3:00 PM [facility] NURSE [facility]  06/18/2020  3:15 PM [facility] US3 [facility]  06/21/2020  3:15 PM Osborne Oman [facility]  06/25/2020  3:00 PM [facility] NURSE [facility]  06/25/2020  3:15 PM [facility] US2 [facility]  06/28/2020  3:55 PM Marylene Land, CNM [facility]  07/02/2020  3:15 PM [facility] NURSE [facility]  07/02/2020  3:15 PM [facility] US3 [facility]  07/05/2020  3:55 PM [facility] Sober [facility]  07/12/2020  3:55 PM Kooistra, Charlesetta Garibaldi, CNM [facility]    Bernerd Limbo, CNM

## 2020-06-13 NOTE — Patient Instructions (Signed)
Blood Pressure Cuff  [facility] [address] [place] [address], [place] [address] Phone: [phone] 

## 2020-06-14 LAB — GC/CHLAMYDIA PROBE AMP (~~LOC~~) NOT AT ARMC
Chlamydia: NEGATIVE
Comment: NEGATIVE
Comment: NORMAL
Neisseria Gonorrhea: NEGATIVE

## 2020-06-18 ENCOUNTER — Ambulatory Visit: Payer: Medicaid Other | Attending: Obstetrics and Gynecology

## 2020-06-18 ENCOUNTER — Ambulatory Visit: Payer: Medicaid Other | Admitting: *Deleted

## 2020-06-18 ENCOUNTER — Encounter: Payer: Self-pay | Admitting: *Deleted

## 2020-06-18 ENCOUNTER — Other Ambulatory Visit: Payer: Self-pay

## 2020-06-18 DIAGNOSIS — O0990 Supervision of high risk pregnancy, unspecified, unspecified trimester: Secondary | ICD-10-CM | POA: Insufficient documentation

## 2020-06-18 DIAGNOSIS — Z148 Genetic carrier of other disease: Secondary | ICD-10-CM

## 2020-06-18 DIAGNOSIS — O09293 Supervision of pregnancy with other poor reproductive or obstetric history, third trimester: Secondary | ICD-10-CM | POA: Diagnosis not present

## 2020-06-18 DIAGNOSIS — Z3A36 36 weeks gestation of pregnancy: Secondary | ICD-10-CM

## 2020-06-18 DIAGNOSIS — O99213 Obesity complicating pregnancy, third trimester: Secondary | ICD-10-CM | POA: Diagnosis not present

## 2020-06-18 LAB — CULTURE, BETA STREP (GROUP B ONLY): Strep Gp B Culture: NEGATIVE

## 2020-06-21 ENCOUNTER — Other Ambulatory Visit: Payer: Self-pay

## 2020-06-21 ENCOUNTER — Ambulatory Visit (INDEPENDENT_AMBULATORY_CARE_PROVIDER_SITE_OTHER): Payer: Medicaid Other | Admitting: Certified Nurse Midwife

## 2020-06-21 VITALS — BP 133/82 | HR 94 | Wt 272.0 lb

## 2020-06-21 DIAGNOSIS — O0990 Supervision of high risk pregnancy, unspecified, unspecified trimester: Secondary | ICD-10-CM

## 2020-06-21 DIAGNOSIS — Z3A37 37 weeks gestation of pregnancy: Secondary | ICD-10-CM

## 2020-06-21 NOTE — Patient Instructions (Signed)
Blood pressure cuff [facility]  [address] [place] [address], [place]  [address]  Phone: [phone] 

## 2020-06-21 NOTE — Progress Notes (Signed)
   PRENATAL VISIT NOTE  Subjective:  Theresa Tran is a 20 y.o. G2P1001 at [redacted]w[redacted]d being seen today for ongoing prenatal care.  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  Patient reports backache and occasional contractions.  Contractions: Irritability. Vag. Bleeding: None.  Movement: Present. Denies leaking of fluid.   The following portions of the patient's history were reviewed and updated as appropriate: allergies, current medications, past family history, past medical history, past social history, past surgical history and problem list.   Objective:   Vitals:   06/21/20 1522  BP: 133/82  Pulse: 94  Weight: 272 lb (123.4 kg)    Fetal Status: Fetal Heart Rate (bpm): 128 Fundal Height: 38 cm Movement: Present     General:  Alert, oriented and cooperative. Patient is in no acute distress.  Skin: Skin is warm and dry. No rash noted.   Cardiovascular: Normal heart rate noted  Respiratory: Normal respiratory effort, no problems with respiration noted  Abdomen: Soft, gravid, appropriate for gestational age.  Pain/Pressure: Present     Pelvic: Cervical exam deferred        Extremities: Normal range of motion.  Edema: None  Mental Status: Normal mood and affect. Normal behavior. Normal judgment and thought content.   Assessment and Plan:  Pregnancy: G2P1001 at [redacted]w[redacted]d 1. Supervision of high risk pregnancy, antepartum - Pt doing well, no complaints and feeling fetal movement  2. [redacted] weeks gestation of pregnancy - Anticipatory guidance on future visits - Emphasized the importance of kick counts in late pregnancy  Term labor symptoms and general obstetric precautions including but not limited to vaginal bleeding, contractions, leaking of fluid and fetal  movement were reviewed in detail with the patient. Please refer to After Visit Summary for other counseling recommendations.   Future Appointments  Date Time Provider Department Center  06/25/2020  3:00 PM [facility] NURSE [facility]  06/25/2020  3:15 PM [facility] US2 [facility]  06/28/2020  3:55 PM Marylene Land, CNM [facility]  07/02/2020  3:15 PM [facility] NURSE [facility]  07/02/2020  3:15 PM [facility] US3 [facility]  07/05/2020  3:55 PM [facility]  07/12/2020  3:55 PM Kooistra, Charlesetta Garibaldi, CNM [facility]    Bernerd Limbo, CNM

## 2020-06-22 ENCOUNTER — Telehealth: Payer: Self-pay | Admitting: Licensed Clinical Social Worker

## 2020-06-22 NOTE — Telephone Encounter (Signed)
Subjective: Theresa Tran is a G2P1001 at [redacted]w[redacted]d completed contraception counseling. She does not have a history of any mental health concerns. She is not currently sexually active. She is currently using pills for birth control. She has had recent STD screening on 04/18/2020 and 06/13/2020. Patient states father of baby as her support system.      Birth Control History:  pills  MDM Patient counseled on all options for birth control today including LARC. Patient desires pilss initiated for birth control   Assessment:  20 y.o. female considering pills for birth control  Plan: No further plan   Gwyndolyn Saxon, [place] 06/22/2020 10:25 AM

## 2020-06-25 ENCOUNTER — Ambulatory Visit: Payer: Medicaid Other

## 2020-06-27 ENCOUNTER — Other Ambulatory Visit: Payer: Self-pay

## 2020-06-27 ENCOUNTER — Ambulatory Visit (HOSPITAL_BASED_OUTPATIENT_CLINIC_OR_DEPARTMENT_OTHER): Payer: Medicaid Other

## 2020-06-27 ENCOUNTER — Ambulatory Visit: Payer: Medicaid Other | Attending: Maternal & Fetal Medicine | Admitting: *Deleted

## 2020-06-27 DIAGNOSIS — Z3A38 38 weeks gestation of pregnancy: Secondary | ICD-10-CM

## 2020-06-27 DIAGNOSIS — O99213 Obesity complicating pregnancy, third trimester: Secondary | ICD-10-CM | POA: Insufficient documentation

## 2020-06-27 DIAGNOSIS — Z148 Genetic carrier of other disease: Secondary | ICD-10-CM | POA: Diagnosis not present

## 2020-06-27 DIAGNOSIS — O09293 Supervision of pregnancy with other poor reproductive or obstetric history, third trimester: Secondary | ICD-10-CM | POA: Insufficient documentation

## 2020-06-27 DIAGNOSIS — O0990 Supervision of high risk pregnancy, unspecified, unspecified trimester: Secondary | ICD-10-CM

## 2020-06-27 DIAGNOSIS — E669 Obesity, unspecified: Secondary | ICD-10-CM

## 2020-06-27 DIAGNOSIS — Z6841 Body Mass Index (BMI) 40.0 and over, adult: Secondary | ICD-10-CM

## 2020-06-28 ENCOUNTER — Encounter: Payer: Medicaid Other | Admitting: Student

## 2020-07-02 ENCOUNTER — Encounter: Payer: Self-pay | Admitting: *Deleted

## 2020-07-02 ENCOUNTER — Ambulatory Visit: Payer: Medicaid Other | Admitting: *Deleted

## 2020-07-02 ENCOUNTER — Other Ambulatory Visit: Payer: Self-pay

## 2020-07-02 ENCOUNTER — Ambulatory Visit: Payer: Medicaid Other | Attending: Obstetrics and Gynecology

## 2020-07-02 DIAGNOSIS — O0990 Supervision of high risk pregnancy, unspecified, unspecified trimester: Secondary | ICD-10-CM

## 2020-07-02 DIAGNOSIS — O09293 Supervision of pregnancy with other poor reproductive or obstetric history, third trimester: Secondary | ICD-10-CM

## 2020-07-02 DIAGNOSIS — Z148 Genetic carrier of other disease: Secondary | ICD-10-CM | POA: Diagnosis not present

## 2020-07-02 DIAGNOSIS — O99213 Obesity complicating pregnancy, third trimester: Secondary | ICD-10-CM | POA: Insufficient documentation

## 2020-07-02 DIAGNOSIS — Z6841 Body Mass Index (BMI) 40.0 and over, adult: Secondary | ICD-10-CM

## 2020-07-02 DIAGNOSIS — E669 Obesity, unspecified: Secondary | ICD-10-CM

## 2020-07-02 DIAGNOSIS — Z3A38 38 weeks gestation of pregnancy: Secondary | ICD-10-CM | POA: Diagnosis not present

## 2020-07-05 ENCOUNTER — Encounter (HOSPITAL_COMMUNITY): Payer: Self-pay | Admitting: Obstetrics and Gynecology

## 2020-07-05 ENCOUNTER — Encounter: Payer: Self-pay | Admitting: Certified Nurse Midwife

## 2020-07-05 ENCOUNTER — Inpatient Hospital Stay (HOSPITAL_COMMUNITY)
Admission: AD | Admit: 2020-07-05 | Discharge: 2020-07-08 | DRG: 807 | Disposition: A | Discharge status: Home / Self Care | Payer: Medicaid Other | Attending: Family Medicine | Admitting: Family Medicine

## 2020-07-05 ENCOUNTER — Other Ambulatory Visit: Payer: Self-pay

## 2020-07-05 ENCOUNTER — Ambulatory Visit (INDEPENDENT_AMBULATORY_CARE_PROVIDER_SITE_OTHER): Payer: Medicaid Other | Admitting: Certified Nurse Midwife

## 2020-07-05 VITALS — BP 149/85 | HR 92 | Wt 272.2 lb

## 2020-07-05 DIAGNOSIS — Z3A39 39 weeks gestation of pregnancy: Secondary | ICD-10-CM | POA: Diagnosis not present

## 2020-07-05 DIAGNOSIS — Z20822 Contact with and (suspected) exposure to covid-19: Secondary | ICD-10-CM | POA: Diagnosis present

## 2020-07-05 DIAGNOSIS — O134 Gestational [pregnancy-induced] hypertension without significant proteinuria, complicating childbirth: Secondary | ICD-10-CM | POA: Diagnosis present

## 2020-07-05 DIAGNOSIS — O133 Gestational [pregnancy-induced] hypertension without significant proteinuria, third trimester: Secondary | ICD-10-CM | POA: Diagnosis present

## 2020-07-05 DIAGNOSIS — O09899 Supervision of other high risk pregnancies, unspecified trimester: Secondary | ICD-10-CM

## 2020-07-05 DIAGNOSIS — Z87891 Personal history of nicotine dependence: Secondary | ICD-10-CM

## 2020-07-05 DIAGNOSIS — O0990 Supervision of high risk pregnancy, unspecified, unspecified trimester: Secondary | ICD-10-CM

## 2020-07-05 HISTORY — DX: Gestational (pregnancy-induced) hypertension without significant proteinuria, third trimester: O13.3

## 2020-07-05 MED ORDER — LACTATED RINGERS IV SOLN
500.0000 mL | INTRAVENOUS | Status: DC | PRN
  Administered 2020-07-06 (×2): 500 mL via INTRAVENOUS

## 2020-07-05 MED ORDER — OXYTOCIN BOLUS FROM INFUSION
333.0000 mL | Freq: Once | INTRAVENOUS | Status: AC
  Administered 2020-07-07: 333 mL via INTRAVENOUS

## 2020-07-05 MED ORDER — OXYTOCIN-SODIUM CHLORIDE 30-0.9 UT/500ML-% IV SOLN
2.5000 [IU]/h | INTRAVENOUS | Status: DC
  Filled 2020-07-05: qty 500

## 2020-07-05 MED ORDER — LACTATED RINGERS IV SOLN: INTRAVENOUS | Status: DC

## 2020-07-05 MED ORDER — TERBUTALINE SULFATE 1 MG/ML IJ SOLN: 0.2500 mg | Freq: Once | INTRAMUSCULAR | Status: DC | PRN

## 2020-07-05 MED ORDER — SOD CITRATE-CITRIC ACID 500-334 MG/5ML PO SOLN: 30.0000 mL | ORAL | Status: DC | PRN

## 2020-07-05 MED ORDER — MISOPROSTOL 25 MCG QUARTER TABLET
25.0000 ug | ORAL_TABLET | ORAL | Status: DC | PRN
  Administered 2020-07-05: 25 ug via VAGINAL
  Filled 2020-07-05: qty 1

## 2020-07-05 MED ORDER — ACETAMINOPHEN 325 MG PO TABS: 650.0000 mg | ORAL_TABLET | ORAL | Status: DC | PRN

## 2020-07-05 MED ORDER — LIDOCAINE HCL (PF) 1 % IJ SOLN: 30.0000 mL | INTRAMUSCULAR | Status: DC | PRN

## 2020-07-05 MED ORDER — ONDANSETRON HCL 4 MG/2ML IJ SOLN
4.0000 mg | Freq: Four times a day (QID) | INTRAMUSCULAR | Status: DC | PRN
  Administered 2020-07-07: 4 mg via INTRAVENOUS
  Filled 2020-07-05: qty 2

## 2020-07-05 NOTE — H&P (Signed)
OBSTETRIC ADMISSION HISTORY AND PHYSICAL  Chania Kochanski is a 20 y.o. female G2P1001 with IUP at [redacted]w[redacted]d by [redacted]w[redacted]d [place] presenting for IOL for gHTN. She reports +FMs, No LOF, no VB, no headaches or peripheral edema, and RUQ pain. No visual changes. She plans on bottle feeding. She request POP's for birth control. She received her prenatal care at [facility]   Dating: By [redacted]w[redacted]d [place] --->  Estimated Date of Delivery: 07/10/20  Sono:    @[redacted]w[redacted]d , CWD, normal anatomy, cephalic presentation, 3845g, EFW   Prenatal History/Complications:   -gestational HTN, new diagnosis. Has hx of gHTN in prior pregnancy.  -BMI 47  Past Medical History: Past Medical History:  Diagnosis Date  . Hypertension     Past Surgical History: Past Surgical History:  Procedure Laterality Date  . NO PAST SURGERIES      Obstetrical History: OB History    Gravida  2   Para  1   Term  1   Preterm      AB      Living  1     SAB      IAB      Ectopic      Multiple  0   Live Births  1           Social History Social History   Socioeconomic History  . Marital status: Single    Spouse name: Not on file  . Number of children: Not on file  . Years of education: Not on file  . Highest education level: Not on file  Occupational History  . Not on file  Tobacco Use  . Smoking status: Former Smoker    Quit date: 07/10/2016    Years since quitting: 3.9  . Smokeless tobacco: Never Used  Substance and Sexual Activity  . Alcohol use: No  . Drug use: No  . Sexual activity: Yes    Birth control/protection: None  Other Topics Concern  . Not on file  Social History Narrative  . Not on file   Social Determinants of Health   Financial Resource Strain: Not on file  Food Insecurity: No Food Insecurity  . Worried About 09/07/2016 in the Last Year: Never true  . Ran Out of Food in the Last Year: Never true  Transportation Needs: No Transportation Needs  . Lack of Transportation (Medical): No   . Lack of Transportation (Non-Medical): No  Physical Activity: Not on file  Stress: Not on file  Social Connections: Not on file    Family History: No family history on file.  Allergies: No Known Allergies  Medications Prior to Admission  Medication Sig Dispense Refill Last Dose  . Prenatal Vit-Fe Fumarate-FA (PRENATAL MULTIVITAMIN) TABS tablet Take 1 tablet by mouth daily at 12 noon.        Review of Systems   All systems reviewed and negative except as stated in HPI  unknown if currently breastfeeding. General appearance: alert, cooperative and appears stated age Lungs: clear to auscultation bilaterally Abdomen: soft, non-tender; bowel sounds normal Pelvic: 1/thick/-3, cephalic by Programme researcher, broadcasting/film/video  Extremities: Homans sign is negative, no sign of DVT Presentation: cephalic Fetal monitoring baseline 130/mod variability/pos accels/no decels  Uterine activityNone     Prenatal labs: ABO, Rh: O/Positive/-- (09/28 1044) Antibody: Negative (09/28 1044) Rubella: 1.37 (09/28 1044) RPR: Non Reactive (11/10 0840)  HBsAg: Negative (09/28 1044)  HIV: Non Reactive (11/10 0835)  GBS: Negative/-- (01/05 1646)  2 hr Glucola normal Genetic screening  Low  risk NIPS  Anatomy US normal   Prenatal Transfer Tool  Maternal Diabetes: No Genetic Screening: Normal Maternal Ultrasounds/Referrals: Normal Fetal Ultrasounds or other Referrals:  Referred to Materal Fetal Medicine  Maternal Substance Abuse:  No Significant Maternal Medications:  None Significant Maternal Lab Results: Group B Strep negative  No results found for this or any previous visit (from the past 24 hour(s)).  Patient Active Problem List   Diagnosis Date Noted  . Gestational hypertension, third trimester 07/05/2020  . 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 Supervision of high risk pregnancy, antepartum 03/06/2020  . High risk teen pregnancy 07/19/2017  . History of gestational hypertension 07/18/2017     Assessment/Plan:  Ruchel Brandenburger is a 20 y.o. G2P1001 at [redacted]w[redacted]d here for IOL for gHTN.  #Induction of Labor: vaginal cytotec administered, recheck in four hours, FB when able.  #Gestational HTN: PEC labs pending. Normotensive on arrival, will continue to monitor.    #Pain: Pain meds, epidural prn  #FWB: Cat I  #ID:  gbs neg #MOF: bottle #MOC:POP's #Circ:  Declines   Gita Kudo, MD  07/05/2020, 10:41 PM

## 2020-07-05 NOTE — Patient Instructions (Signed)
Labor Induction Labor induction is when steps are taken to cause a pregnant woman to begin the labor process. Most women go into labor on their own between 37 weeks and 42 weeks of pregnancy. When this does not happen, or when there is a medical need for labor to begin, steps may be taken to induce, or bring on, labor. Labor induction causes a pregnant woman's uterus to contract. It also causes the cervix to soften (ripen), open (dilate), and thin out. Usually, labor is not induced before 39 weeks of pregnancy unless there is a medical reason to do so. When is labor induction considered? Labor induction may be right for you if:  Your pregnancy lasts longer than 41 to 42 weeks.  Your placenta is separating from your uterus (placental abruption).  You have a rupture of membranes and your labor does not begin.  You have health problems, like diabetes or high blood pressure (preeclampsia) during your pregnancy.  Your baby has stopped growing or does not have enough amniotic fluid. Before labor induction begins, your health care provider will consider the following factors:  Your medical condition and the baby's condition.  How many weeks you have been pregnant.  How mature the baby's lungs are.  The condition of your cervix.  The position of the baby.  The size of your birth canal. Tell a health care provider about:  Any allergies you have.  All medicines you are taking, including vitamins, herbs, eye drops, creams, and over-the-counter medicines.  Any problems you or your family members have had with anesthetic medicines.  Any surgeries you have had.  Any blood disorders you have.  Any medical conditions you have. What are the risks? Generally, this is a safe procedure. However, problems may occur, including:  Failed induction.  Changes in fetal heart rate, such as being too high, too low, or irregular (erratic).  Infection in the mother or the baby.  Increased risk of  having a cesarean delivery.  Breaking off (abruption) of the placenta from the uterus. This is rare.  Rupture of the uterus. This is very rare.  Your baby could fail to get enough blood flow or oxygen. This can be life-threatening. When induction is needed for medical reasons, the benefits generally outweigh the risks. What happens during the procedure? During the procedure, your health care provider will use one of these methods to induce labor:  Stripping the membranes. In this method, the amniotic sac tissue is gently separated from the cervix. This causes the following to happen: ? Your cervix stretches, which in turn causes the release of prostaglandins. ? Prostaglandins induce labor and cause the uterus to contract. ? This procedure is often done in an office visit. You will be sent home to wait for contractions to begin.  Prostaglandin medicine. This medicine starts contractions and causes the cervix to dilate and ripen. This can be taken by mouth (orally) or by being inserted into the vagina (suppository).  Inserting a small, thin tube (catheter) with a balloon into the vagina and then expanding the balloon with water to dilate the cervix.  Breaking the water. In this method, a small instrument is used to make a small hole in the amniotic sac. This eventually causes the amniotic sac to break. Contractions should begin within a few hours.  Medicine to trigger or strengthen contractions. This medicine is given through an IV that is inserted into a vein in your arm. This procedure may vary among health care providers and hospitals.     Where to find more information  March of D[employer]: www.marchofdimes.org  The American College of Obstetricians and Gynecologists: www.acog.org Summary  Labor induction causes a pregnant woman's uterus to contract. It also causes the cervix to soften (ripen), open (dilate), and thin out.  Labor is usually not induced before 39 weeks of pregnancy unless  there is a medical reason to do so.  When induction is needed for medical reasons, the benefits generally outweigh the risks.  Talk with your health care provider about which methods of labor induction are right for you. This information is not intended to replace advice given to you by your health care provider. Make sure you discuss any questions you have with your health care provider. Document Revised: 03/08/2020 Document Reviewed: 03/08/2020 Elsevier Patient Education  2021 Elsevier Inc.  

## 2020-07-05 NOTE — Progress Notes (Signed)
   PRENATAL VISIT NOTE  Subjective:  Theresa Tran is a 20 y.o. G2P1001 at [redacted]w[redacted]d being seen today for ongoing prenatal care.  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  Patient reports occasional vision disturbances "like if you get hot and feel like you might pass out".  Contractions: Irritability. Vag. Bleeding: None.  Movement: Present. Denies leaking of fluid.   The following portions of the patient's history were reviewed and updated as appropriate: allergies, current medications, past family history, past medical history, past social history, past surgical history and problem list.   Objective:   Vitals:   07/05/20 1611  BP: (!) 149/85  Pulse: 92  Weight: 272 lb 3.2 oz (123.5 kg)    Fetal Status: Fetal Heart Rate (bpm): 145   Movement: Present  Presentation: Vertex  General:  Alert, oriented and cooperative. Patient is in no acute distress.  Skin: Skin is warm and dry. No rash noted.   Cardiovascular: Normal heart rate noted  Respiratory: Normal respiratory effort, no problems with respiration noted  Abdomen: Soft, gravid, appropriate for gestational age.  Pain/Pressure: Present     Pelvic: Cervical exam performed in the presence of a chaperone Dilation: Closed Effacement (%): Thick Station: -3  Extremities: Normal range of motion.  Edema: None  Mental Status: Normal mood and affect. Normal behavior. Normal judgment and thought content.   Assessment and Plan:  Pregnancy: G2P1001 at [redacted]w[redacted]d 1. Supervision of high risk pregnancy, antepartum - Patient was seen in MFM on 1/24 and had elevated BP. MFM recommended delivery between 39-40 weeks. - Elevated BP today in office, diagnosed with GHTN  - patient also reports visual disturbance for the past 2  days, denies HA, or RUQ pain   2. Gestational hypertension, third trimester - Patient will be sent to L&D as a direct admission  - Called L&D team for notification and orders for admission placed  - Discussed with patient progression and process of induction, patient to go home and drop off kid, get bags then present to hospital   3. [redacted] weeks gestation of pregnancy - to L&D for delivery   Term labor symptoms and general obstetric precautions including but not limited to vaginal bleeding, contractions, leaking of fluid and fetal movement were reviewed in detail with the patient. Please refer to After Visit Summary for other counseling recommendations.   Return in about 5 weeks (around 08/09/2020) for POSTPARTUM.   Sharyon Cable, CNM

## 2020-07-06 ENCOUNTER — Inpatient Hospital Stay (HOSPITAL_COMMUNITY): Payer: Medicaid Other | Admitting: Anesthesiology

## 2020-07-06 ENCOUNTER — Encounter (HOSPITAL_COMMUNITY): Payer: Self-pay | Admitting: Obstetrics and Gynecology

## 2020-07-06 LAB — CBC
HCT: 37.8 % (ref 36.0–46.0)
Hemoglobin: 13 g/dL (ref 12.0–15.0)
MCH: 28 pg (ref 26.0–34.0)
MCHC: 34.4 g/dL (ref 30.0–36.0)
MCV: 81.5 fL (ref 80.0–100.0)
Platelets: 292 10*3/uL (ref 150–400)
RBC: 4.64 MIL/uL (ref 3.87–5.11)
RDW: 13.4 % (ref 11.5–15.5)
WBC: 10.2 10*3/uL (ref 4.0–10.5)
nRBC: 0 % (ref 0.0–0.2)

## 2020-07-06 LAB — COMPREHENSIVE METABOLIC PANEL
ALT: 13 U/L (ref 0–44)
AST: 23 U/L (ref 15–41)
Albumin: 2.9 g/dL — ABNORMAL LOW (ref 3.5–5.0)
Alkaline Phosphatase: 226 U/L — ABNORMAL HIGH (ref 38–126)
Anion gap: 11 (ref 5–15)
BUN: 8 mg/dL (ref 6–20)
CO2: 18 mmol/L — ABNORMAL LOW (ref 22–32)
Calcium: 8.8 mg/dL — ABNORMAL LOW (ref 8.9–10.3)
Chloride: 106 mmol/L (ref 98–111)
Creatinine, Ser: 0.61 mg/dL (ref 0.44–1.00)
GFR, Estimated: >60 mL/min (ref >60)
Glucose, Bld: 100 mg/dL — ABNORMAL HIGH (ref 70–99)
Potassium: 3.8 mmol/L (ref 3.5–5.1)
Sodium: 135 mmol/L (ref 135–145)
Total Bilirubin: 0.8 mg/dL (ref 0.3–1.2)
Total Protein: 6.5 g/dL (ref 6.5–8.1)

## 2020-07-06 LAB — PROTEIN / CREATININE RATIO, URINE
Creatinine, Urine: 352.02 mg/dL
Protein Creatinine Ratio: 0.07 mg/mg{Cre} (ref 0.00–0.15)
Total Protein, Urine: 24 mg/dL

## 2020-07-06 LAB — TYPE AND SCREEN
ABO/RH(D): O POS
Antibody Screen: NEGATIVE

## 2020-07-06 LAB — SARS CORONAVIRUS 2 BY RT PCR (HOSPITAL ORDER, PERFORMED IN ~~LOC~~ HOSPITAL LAB): SARS Coronavirus 2: NEGATIVE

## 2020-07-06 LAB — RPR: RPR Ser Ql: NONREACTIVE

## 2020-07-06 MED ORDER — PHENYLEPHRINE 40 MCG/ML (10ML) SYRINGE FOR IV PUSH (FOR BLOOD PRESSURE SUPPORT): 80.0000 ug | PREFILLED_SYRINGE | INTRAVENOUS | Status: DC | PRN

## 2020-07-06 MED ORDER — TERBUTALINE SULFATE 1 MG/ML IJ SOLN: 0.2500 mg | Freq: Once | INTRAMUSCULAR | Status: DC | PRN

## 2020-07-06 MED ORDER — OXYTOCIN-SODIUM CHLORIDE 30-0.9 UT/500ML-% IV SOLN
1.0000 m[IU]/min | INTRAVENOUS | Status: DC
  Administered 2020-07-06: 2 m[IU]/min via INTRAVENOUS
  Filled 2020-07-06: qty 500

## 2020-07-06 MED ORDER — FENTANYL-BUPIVACAINE-NACL 0.5-0.125-0.9 MG/250ML-% EP SOLN
12.0000 mL/h | EPIDURAL | Status: DC | PRN
  Administered 2020-07-06: 12 mL/h via EPIDURAL
  Filled 2020-07-06: qty 250

## 2020-07-06 MED ORDER — NALBUPHINE HCL 10 MG/ML IJ SOLN
10.0000 mg | INTRAMUSCULAR | Status: DC | PRN
  Administered 2020-07-06 (×2): 10 mg via INTRAVENOUS
  Filled 2020-07-06 (×2): qty 1

## 2020-07-06 MED ORDER — EPHEDRINE 5 MG/ML INJ: 10.0000 mg | INTRAVENOUS | Status: DC | PRN

## 2020-07-06 MED ORDER — DIPHENHYDRAMINE HCL 50 MG/ML IJ SOLN: 12.5000 mg | INTRAMUSCULAR | Status: DC | PRN

## 2020-07-06 MED ORDER — LIDOCAINE HCL (PF) 1 % IJ SOLN
INTRAMUSCULAR | Status: DC | PRN
  Administered 2020-07-06 (×2): 4 mL via EPIDURAL

## 2020-07-06 MED ORDER — LACTATED RINGERS IV SOLN: 500.0000 mL | Freq: Once | INTRAVENOUS | Status: DC

## 2020-07-06 NOTE — Anesthesia Procedure Notes (Signed)
Epidural Patient location during procedure: OB Start time: 07/06/2020 8:18 PM End time: 07/06/2020 8:26 PM  Staffing Anesthesiologist: Mal Amabile, MD Performed: anesthesiologist   Preanesthetic Checklist Completed: patient identified, IV checked, site marked, risks and benefits discussed, surgical consent, monitors and equipment checked, pre-op evaluation and timeout performed  Epidural Patient position: sitting Prep: DuraPrep and site prepped and draped Patient monitoring: continuous pulse ox and blood pressure Approach: midline Location: L3-L4 Injection technique: LOR air  Needle:  Needle type: Tuohy  Needle gauge: 17 G Needle length: 9 cm and 9 Needle insertion depth: 8 cm Catheter type: closed end flexible Catheter size: 19 Gauge Catheter at skin depth: 13 cm Test dose: negative and Other  Assessment Events: blood not aspirated, injection not painful, no injection resistance, no paresthesia and negative IV test  Additional Notes Patient identified. Risks and benefits discussed including failed block, incomplete  Pain control, post dural puncture headache, nerve damage, paralysis, blood pressure Changes, nausea, vomiting, reactions to medications-both toxic and allergic and post Partum back pain. All questions were answered. Patient expressed understanding and wished to proceed. Sterile technique was used throughout procedure. Epidural site was Dressed with sterile barrier dressing. No paresthesias, signs of intravascular injection Or signs of intrathecal spread were encountered.  Patient was more comfortable after the epidural was dosed. Please see RN's note for documentation of vital signs and FHR which are stable. Reason for block:procedure for pain

## 2020-07-06 NOTE — Progress Notes (Signed)
LABOR PROGRESS NOTE  Theresa Tran is a 20 y.o. G2P1001 at [redacted]w[redacted]d  admitted for IOL for gHTN   Subjective: Patient doing well, just woke up from a nap  - Patient reports occasional cramping but denies breathing through contractions   Objective: BP 126/82   Pulse 81   Temp 98.7 F (37.1 C) (Oral)   Resp 16   Ht 5\' 4"  (1.626 m)   Wt 124.1 kg   BMI 46.96 kg/m  or  Vitals:   07/06/20 1105 07/06/20 1145 07/06/20 1230 07/06/20 1255  BP: (!) 146/93 (!) 137/52 126/82   Pulse: 73 69 81   Resp:    16  Temp:    98.7 F (37.1 C)  TempSrc:    Oral  Weight:      Height:        AROM@1255 , clear fluid  Dilation: 5 Effacement (%): 60 Station: -3 Presentation: Vertex Exam by:: [email or web address], CNM FHT: baseline rate 140, moderate varibility, +accel, no decel Toco: 2-4 minutes/ mild by palpation   Labs: Lab Results  Component Value Date   WBC 10.2 07/05/2020   HGB 13.0 07/05/2020   HCT 37.8 07/05/2020   MCV 81.5 07/05/2020   PLT 292 07/05/2020    Patient Active Problem List   Diagnosis Date Noted  . Gestational hypertension, third trimester 07/05/2020  . 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 Supervision of high risk pregnancy, antepartum 03/06/2020  . High risk teen pregnancy 07/19/2017  . History of gestational hypertension 07/18/2017    Assessment / Plan: 20 y.o. G2P1001 at [redacted]w[redacted]d here for IOL   Labor: Progressing well on pitocin. AROM @ 1255. Continue to titrate pitocin to active labor. Possible IUPC at next cervical examination if minimal to no cervical change  Fetal Wellbeing:  Cat I  Pain Control:  Pain medication PRN  Anticipated MOD:  SVD  [redacted]w[redacted]d, CNM 07/06/2020, 1:21 PM

## 2020-07-06 NOTE — Progress Notes (Signed)
Labor Progress Note Theresa Tran is a 20 y.o. G2P1001 at [redacted]w[redacted]d presented for IOL for gHTN.   S: Feeling well, some cramping, unsure about epidural, will ask again around 1200.   O:  BP (!) 145/87   Pulse 75   Temp 98.1 F (36.7 C) (Oral)   Resp 16   Ht 5\' 4"  (1.626 m)   Wt 124.1 kg   BMI 46.96 kg/m  EFM: baseline 125bpm / moderate variability / + accels / no decels /irregular contractions  CVE: Dilation: 4 Effacement (%): 60 Station: -3 Presentation: Vertex Exam by:: [email or web address] CNM   A&P: 20 y.o. G2P1001 [redacted]w[redacted]d presented for IOL for gHTN.  #Labor: Progressing well. Currently on pitocin dicussed with patient possible ROM at this time or gave patient option to reassess in two hours. Patient decided to wait until 1200 for reassessment.  #Pain: pain meds, epidural as needed  #gHTN: continuing to monitor  #FWB: Category 1 #GBS negative  [redacted]w[redacted]d, Student-PA 11:01 AM

## 2020-07-06 NOTE — Anesthesia Preprocedure Evaluation (Signed)
Anesthesia Evaluation  Patient identified by MRN, date of birth, ID band Patient awake    Reviewed: Allergy & Precautions, Patient's Chart, lab work & pertinent test results  Airway Mallampati: II  TM Distance: >3 FB Neck ROM: Full    Dental no notable dental hx. (+) Teeth Intact   Pulmonary former smoker,    Pulmonary exam normal breath sounds clear to auscultation       Cardiovascular hypertension, Normal cardiovascular exam Rhythm:Regular Rate:Normal     Neuro/Psych negative neurological ROS  negative psych ROS   GI/Hepatic Neg liver ROS, GERD  Medicated,  Endo/Other  Morbid obesity  Renal/GU negative Renal ROS  negative genitourinary   Musculoskeletal negative musculoskeletal ROS (+)   Abdominal (+) + obese,   Peds  Hematology  (+) anemia ,   Anesthesia Other Findings   Reproductive/Obstetrics (+) Pregnancy Gestational HTN                             Anesthesia Physical Anesthesia Plan  ASA: III  Anesthesia Plan: Epidural   Post-op Pain Management:    Induction:   PONV Risk Score and Plan:   Airway Management Planned: Natural Airway  Additional Equipment:   Intra-op Plan:   Post-operative Plan:   Informed Consent: I have reviewed the patients History and Physical, chart, labs and discussed the procedure including the risks, benefits and alternatives for the proposed anesthesia with the patient or authorized representative who has indicated his/her understanding and acceptance.       Plan Discussed with: Anesthesiologist  Anesthesia Plan Comments:         Anesthesia Quick Evaluation

## 2020-07-06 NOTE — Progress Notes (Signed)
Labor Progress Note Theresa Tran is a 20 y.o. G2P1001 at [redacted]w[redacted]d presented for IOL for gHTN S: Feeling contractions frequently   O:  BP 123/67   Pulse 78   Temp 98.3 F (36.8 C)   Resp 18   Ht 5\' 4"  (1.626 m)   Wt 124.1 kg   BMI 46.96 kg/m  EFM: baseline 135/mod variability/pos accels/neg decels   CVE: Dilation: 1 Effacement (%): Thick Station: -3 Presentation: Vertex Exam by:: Dr. [email or web address]   A&P: 20 y.o. G2P1001 [redacted]w[redacted]d here for IOl for gHTN  #Labor: Progressing well. S/p cytotec x1. FB placed at this time. Contracting frequently so will monitor for now. Likely start pitocin in next 1-2 hours.  #Pain: pain meds, epidural prn  #gestational HTN: PEC labs normal. Normotensive since arrival   #FWB: cat I  #GBS negative   [redacted]w[redacted]d, MD 4:27 AM

## 2020-07-06 NOTE — Progress Notes (Signed)
Vitals:   07/06/20 1657 07/06/20 1747 07/06/20 1847 07/06/20 1857  BP: 114/68 135/83 (!) 152/84 139/84  Pulse: 78 81 94 93  Resp: 18     Temp:  98.3 F (36.8 C)    TempSrc:  Oral    Weight:      Height:        Patient breathing through contractions, crying and not tolerating contractions well. Patient received second dose of IV pain medication. Does not want epidural.   Dilation: 6 Effacement (%): 70 Station: -2 Presentation: Vertex Exam by:: Aundria Rud, CNM Currently on 18 milli-unit/min of pitocin - IUPC placed @1905 .   Continue to titrate pitocin to active labor and MVUs Cat I tracing - FHR 130/mod-min/+accels/ early decel   , CNM 07/06/20, 7:21 PM

## 2020-07-06 NOTE — Progress Notes (Signed)
To bedside for FHR change.  FSE placed without complication  FHR 130/moderate/+accels/ early decelerations   Patient not tolerating pain - discussed with patient epidural at this time, patient agrees to epidural.   Dilation: 6 Effacement (%): 90 Cervical Position: Middle Station: -1 Presentation: Vertex Exam by:: Aundria Rud, CNM Increased bloody show and cervical effacement   Patient is transitioning, expect delivery soon.   Sharyon Cable, CNM 07/06/20, 8:00 PM

## 2020-07-07 ENCOUNTER — Encounter (HOSPITAL_COMMUNITY): Payer: Self-pay | Admitting: Obstetrics and Gynecology

## 2020-07-07 DIAGNOSIS — Z3A39 39 weeks gestation of pregnancy: Secondary | ICD-10-CM

## 2020-07-07 DIAGNOSIS — O134 Gestational [pregnancy-induced] hypertension without significant proteinuria, complicating childbirth: Secondary | ICD-10-CM

## 2020-07-07 LAB — CBC
HCT: 30.7 % — ABNORMAL LOW (ref 36.0–46.0)
Hemoglobin: 10.5 g/dL — ABNORMAL LOW (ref 12.0–15.0)
MCH: 28.2 pg (ref 26.0–34.0)
MCHC: 34.2 g/dL (ref 30.0–36.0)
MCV: 82.3 fL (ref 80.0–100.0)
Platelets: 254 10*3/uL (ref 150–400)
RBC: 3.73 MIL/uL — ABNORMAL LOW (ref 3.87–5.11)
RDW: 13.2 % (ref 11.5–15.5)
WBC: 18.1 10*3/uL — ABNORMAL HIGH (ref 4.0–10.5)
nRBC: 0 % (ref 0.0–0.2)

## 2020-07-07 MED ORDER — ACETAMINOPHEN 325 MG PO TABS
650.0000 mg | ORAL_TABLET | ORAL | Status: DC | PRN
Start: 2020-07-07 — End: 2020-07-08
  Administered 2020-07-07 – 2020-07-08 (×4): 650 mg via ORAL
  Filled 2020-07-07 (×4): qty 2

## 2020-07-07 MED ORDER — PRENATAL MULTIVITAMIN CH
1.0000 | ORAL_TABLET | Freq: Every day | ORAL | Status: DC
  Administered 2020-07-07 – 2020-07-08 (×2): 1 via ORAL
  Filled 2020-07-07 (×2): qty 1

## 2020-07-07 MED ORDER — ONDANSETRON HCL 4 MG/2ML IJ SOLN: 4.0000 mg | INTRAMUSCULAR | Status: DC | PRN

## 2020-07-07 MED ORDER — TRANEXAMIC ACID-NACL 1000-0.7 MG/100ML-% IV SOLN
1000.0000 mg | INTRAVENOUS | Status: AC
  Administered 2020-07-07: 1000 mg via INTRAVENOUS

## 2020-07-07 MED ORDER — ONDANSETRON HCL 4 MG PO TABS
4.0000 mg | ORAL_TABLET | ORAL | Status: DC | PRN
Start: 2020-07-07 — End: 2020-07-08

## 2020-07-07 MED ORDER — COCONUT OIL OIL: 1.0000 "application " | TOPICAL_OIL | Status: DC | PRN

## 2020-07-07 MED ORDER — WITCH HAZEL-GLYCERIN EX PADS: 1.0000 "application " | MEDICATED_PAD | CUTANEOUS | Status: DC | PRN

## 2020-07-07 MED ORDER — SENNOSIDES-DOCUSATE SODIUM 8.6-50 MG PO TABS
2.0000 | ORAL_TABLET | ORAL | Status: DC
  Administered 2020-07-07 – 2020-07-08 (×2): 2 via ORAL
  Filled 2020-07-07 (×2): qty 2

## 2020-07-07 MED ORDER — MISOPROSTOL 200 MCG PO TABS
ORAL_TABLET | ORAL | Status: AC
  Filled 2020-07-07: qty 4

## 2020-07-07 MED ORDER — ZOLPIDEM TARTRATE 5 MG PO TABS: 5.0000 mg | ORAL_TABLET | Freq: Every evening | ORAL | Status: DC | PRN

## 2020-07-07 MED ORDER — IBUPROFEN 600 MG PO TABS
600.0000 mg | ORAL_TABLET | Freq: Four times a day (QID) | ORAL | Status: DC
  Administered 2020-07-07 – 2020-07-08 (×6): 600 mg via ORAL
  Filled 2020-07-07 (×6): qty 1

## 2020-07-07 MED ORDER — SIMETHICONE 80 MG PO CHEW: 80.0000 mg | CHEWABLE_TABLET | ORAL | Status: DC | PRN

## 2020-07-07 MED ORDER — BENZOCAINE-MENTHOL 20-0.5 % EX AERO: 1.0000 "application " | INHALATION_SPRAY | CUTANEOUS | Status: DC | PRN

## 2020-07-07 MED ORDER — TRANEXAMIC ACID-NACL 1000-0.7 MG/100ML-% IV SOLN
INTRAVENOUS | Status: AC
  Filled 2020-07-07: qty 100

## 2020-07-07 MED ORDER — DIBUCAINE (PERIANAL) 1 % EX OINT: 1.0000 "application " | TOPICAL_OINTMENT | CUTANEOUS | Status: DC | PRN

## 2020-07-07 MED ORDER — MISOPROSTOL 200 MCG PO TABS
800.0000 ug | ORAL_TABLET | Freq: Once | ORAL | Status: AC
  Administered 2020-07-07: 800 ug via RECTAL

## 2020-07-07 MED ORDER — TETANUS-DIPHTH-ACELL PERTUSSIS 5-2.5-18.5 LF-MCG/0.5 IM SUSY: 0.5000 mL | PREFILLED_SYRINGE | Freq: Once | INTRAMUSCULAR | Status: DC

## 2020-07-07 MED ORDER — DIPHENHYDRAMINE HCL 25 MG PO CAPS: 25.0000 mg | ORAL_CAPSULE | Freq: Four times a day (QID) | ORAL | Status: DC | PRN

## 2020-07-07 NOTE — Progress Notes (Signed)
LABOR PROGRESS NOTE  Theresa Tran is a 20 y.o. G2P1001 at [redacted]w[redacted]d  admitted for IOL for gHTN   Subjective: Patient much more comfortable with an epidural now  Objective: BP (!) 109/47   Pulse (!) 132   Temp 99.6 F (37.6 C) (Axillary)   Resp 18   Ht 5\' 4"  (1.626 m)   Wt 124.1 kg   SpO2 100%   BMI 46.96 kg/m  or  Vitals:   07/06/20 2330 07/06/20 2335 07/06/20 2340 07/06/20 2345  BP:   (!) 109/47   Pulse:   (!) 132   Resp:      Temp:    99.6 F (37.6 C)  TempSrc:    Axillary  SpO2: 100% 100%    Weight:      Height:        Dilation: 9 Effacement (%): 100 Cervical Position: Middle Station: -1 Presentation: Vertex Exam by:: Clemens Lachman MD FHT: baseline rate 130, moderate varibility, +accel, early decels Toco: 2-3 minutes  Labs: Lab Results  Component Value Date   WBC 10.2 07/05/2020   HGB 13.0 07/05/2020   HCT 37.8 07/05/2020   MCV 81.5 07/05/2020   PLT 292 07/05/2020    Patient Active Problem List   Diagnosis Date Noted  . Gestational hypertension, third trimester 07/05/2020  . 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 Supervision of high risk pregnancy, antepartum 03/06/2020  . High risk teen pregnancy 07/19/2017  . History of gestational hypertension 07/18/2017    Assessment / Plan: 20 y.o. G2P1001 at [redacted]w[redacted]d here for IOL for gHTN  Labor: Progressing well on pitocin at 60mu/min. S/p AROM @ 1255, IUPC and FSE in place. Patient nearly complete except for anterior lip and fetus position is high. Will labor down using positional changes for more pelvic engagement. Fetal Wellbeing:  Cat I  Pain Control:  epidural Anticipated MOD:  SVD gHTN: PEC labs normal on admission. One severe range BP at 2021 to 171/95, otherwise BP normotensive to MR. Will continue to monitor.  16m, MD 07/07/2020, 12:24 AM

## 2020-07-07 NOTE — Anesthesia Postprocedure Evaluation (Signed)
Anesthesia Post Note  Patient: Theresa Tran  Procedure(s) Performed: AN AD HOC LABOR EPIDURAL     Patient location during evaluation: Mother Baby Anesthesia Type: Epidural Level of consciousness: awake and alert Pain management: pain level controlled Vital Signs Assessment: post-procedure vital signs reviewed and stable Respiratory status: spontaneous breathing, nonlabored ventilation and respiratory function stable Cardiovascular status: stable Postop Assessment: no headache, no backache and epidural receding Anesthetic complications: no   No complications documented.  Last Vitals:  Vitals:   07/07/20 0830 07/07/20 1303  BP: 102/67 102/60  Pulse: 70 90  Resp: 18   Temp: 36.7 C   SpO2: 100%     Last Pain:  Vitals:   07/07/20 1303  TempSrc:   PainSc: 0-No pain   Pain Goal:                   Rica Records

## 2020-07-08 DIAGNOSIS — O135 Gestational [pregnancy-induced] hypertension without significant proteinuria, complicating the puerperium: Secondary | ICD-10-CM

## 2020-07-08 MED ORDER — IBUPROFEN 600 MG PO TABS: 600.0000 mg | ORAL_TABLET | Freq: Four times a day (QID) | ORAL | 0 refills | Status: DC

## 2020-07-08 MED ORDER — ACETAMINOPHEN 325 MG PO TABS: 650.0000 mg | ORAL_TABLET | ORAL | Status: AC | PRN

## 2020-07-08 MED ORDER — NORETHINDRONE 0.35 MG PO TABS: 1.0000 | ORAL_TABLET | Freq: Every day | ORAL | 11 refills | Status: DC

## 2020-07-08 NOTE — Discharge Summary (Signed)
Postpartum Discharge Summary     Patient Name: Theresa Tran DOB: 10-01-2000 MRN: [id number]  Date of admission: 07/05/2020 Delivery date:07/07/2020  Delivering provider: Gladys Damme  Date of discharge: 07/08/2020  Admitting diagnosis: Gestational hypertension, third trimester [O13.3] Intrauterine pregnancy: [redacted]w[redacted]d    Secondary diagnosis:  Active Problems:   High risk teen pregnancy   Gestational hypertension, third trimester   Vaginal delivery  Additional problems: none     Discharge diagnosis: Term Pregnancy Delivered and Gestational Hypertension                                              Post partum procedures:none Augmentation: AROM, Pitocin, Cytotec and IP Foley Complications: None  Hospital course: Induction of Labor With Vaginal Delivery   20y.o. yo G2P2002 at 34w4das admitted to the hospital 07/05/2020 for induction of labor.  Indication for induction: Gestational hypertension.  Patient had an uncomplicated labor course as follows: Membrane Rupture Time/Date: 12:55 PM ,07/06/2020   Delivery Method:Vaginal, Spontaneous  Episiotomy: None  Lacerations:  None  Details of delivery can be found in separate delivery note.  Patient had a routine postpartum course. Her postpartum blood pressures were all normotensive. Patient is discharged home 07/08/20.  Newborn Data: Birth date:07/07/2020  Birth time:12:58 AM  Gender:Female  Living status:Living  Apgars:8 ,9  Weight:3666 g   Magnesium Sulfate received: No BMZ received: No Rhophylac:N/A MMR:N/A T-DaP:Given prenatally Flu: No Transfusion:No  Physical exam  Vitals:   07/07/20 1303 07/07/20 1632 07/07/20 2016 07/08/20 0548  BP: 102/60 91/75 (!) 111/59 107/60  Pulse: 90 69 71 63  Resp:  '18 18 18  ' Temp:  98.7 F (37.1 C) 98.4 F (36.9 C) 97.6 F (36.4 C)  TempSrc:  Oral Oral Oral  SpO2:   100% 98%  Weight:      Height:       General: alert, cooperative and no distress Lochia:  appropriate Uterine Fundus: firm Incision: Healing well with no significant drainage DVT Evaluation: No evidence of DVT seen on physical exam. Labs: Lab Results  Component Value Date   WBC 18.1 (H) 07/07/2020   HGB 10.5 (L) 07/07/2020   HCT 30.7 (L) 07/07/2020   MCV 82.3 07/07/2020   PLT 254 07/07/2020   CMP Latest Ref Rng & Units 07/05/2020  Glucose 70 - 99 mg/dL 100(H)  BUN 6 - 20 mg/dL 8  Creatinine 0.44 - 1.00 mg/dL 0.61  Sodium 135 - 145 mmol/L 135  Potassium 3.5 - 5.1 mmol/L 3.8  Chloride 98 - 111 mmol/L 106  CO2 22 - 32 mmol/L 18(L)  Calcium 8.9 - 10.3 mg/dL 8.8(L)  Total Protein 6.5 - 8.1 g/dL 6.5  Total Bilirubin 0.3 - 1.2 mg/dL 0.8  Alkaline Phos 38 - 126 U/L 226(H)  AST 15 - 41 U/L 23  ALT 0 - 44 U/L 13   Edinburgh Score: Edinburgh Postnatal Depression Scale Screening Tool 07/08/2020  I have been able to laugh and see the funny side of things. 0  I have looked forward with enjoyment to things. 0  I have blamed myself unnecessarily when things went wrong. 0  I have been anxious or worried for no good reason. 0  I have felt scared or panicky for no good reason. 0  Things have been getting on top of me. 1  I have been so unhappy that  I have had difficulty sleeping. 0  I have felt sad or miserable. 0  I have been so unhappy that I have been crying. 0  The thought of harming myself has occurred to me. 0  Edinburgh Postnatal Depression Scale Total 1     After visit meds:  Allergies as of 07/08/2020   No Known Allergies     Medication List    TAKE these medications   acetaminophen 325 MG tablet Commonly known as: Tylenol Take 2 tablets (650 mg total) by mouth every 4 (four) hours as needed (for pain scale < 4).   ibuprofen 600 MG tablet Commonly known as: ADVIL Take 1 tablet (600 mg total) by mouth every 6 (six) hours.   prenatal multivitamin Tabs tablet Take 1 tablet by mouth daily at 12 noon.        Discharge home in stable condition Infant  Feeding: Bottle Infant Disposition:home with mother Discharge instruction: per After Visit Summary and Postpartum booklet. Activity: Advance as tolerated. Pelvic rest for 6 weeks.  Diet: routine diet Future Appointments:No future appointments. Follow up Visit:   Please schedule this patient for a In person postpartum visit in 6 weeks with the following provider: Any provider. Additional Postpartum F/U:BP check 1 week  Low risk pregnancy complicated by: HTN Delivery mode:  Vaginal, Spontaneous  Anticipated Birth Control:  POPs   07/08/2020 Janet Berlin, MD

## 2020-07-10 ENCOUNTER — Inpatient Hospital Stay (HOSPITAL_COMMUNITY): Admit: 2020-07-10 | Payer: Self-pay

## 2020-07-12 ENCOUNTER — Encounter: Payer: Medicaid Other | Admitting: Student

## 2020-07-16 ENCOUNTER — Ambulatory Visit: Payer: Medicaid Other

## 2020-08-17 ENCOUNTER — Ambulatory Visit: Payer: Medicaid Other | Admitting: Family Medicine

## 2020-11-08 IMAGING — US US MFM OB FOLLOW-UP
1 series · 13 of 28 positions shown · non-contrast
Comparison: none

[Series 1: us mfm ob follow-up · 13 of 82 slices shown]
[im 4/82]
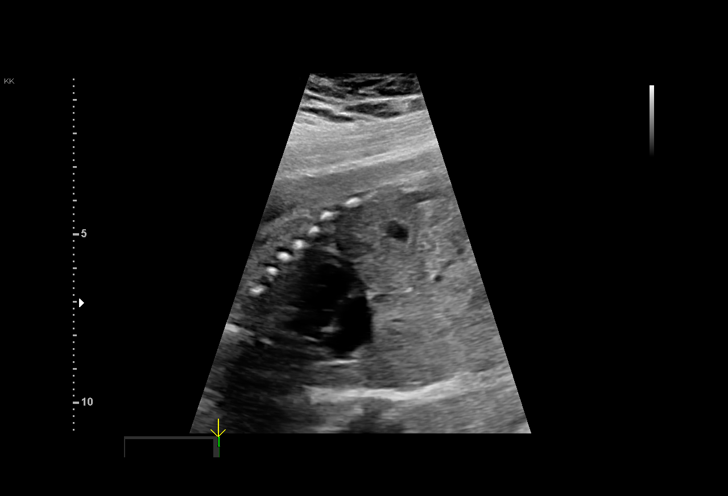
[im 10/82]
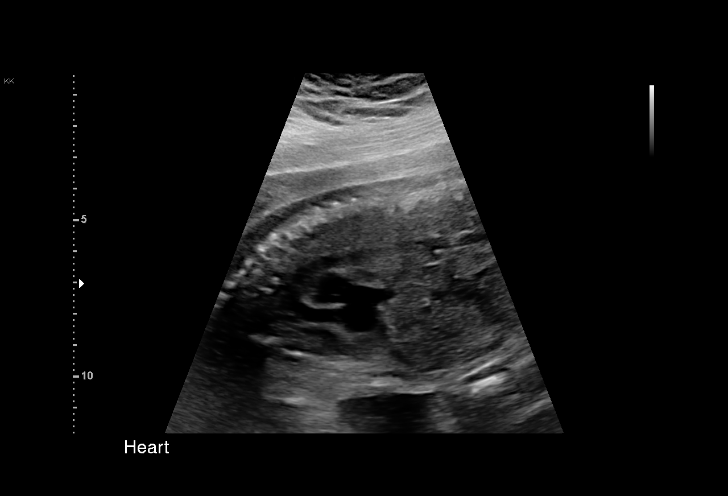
[im 16/82]
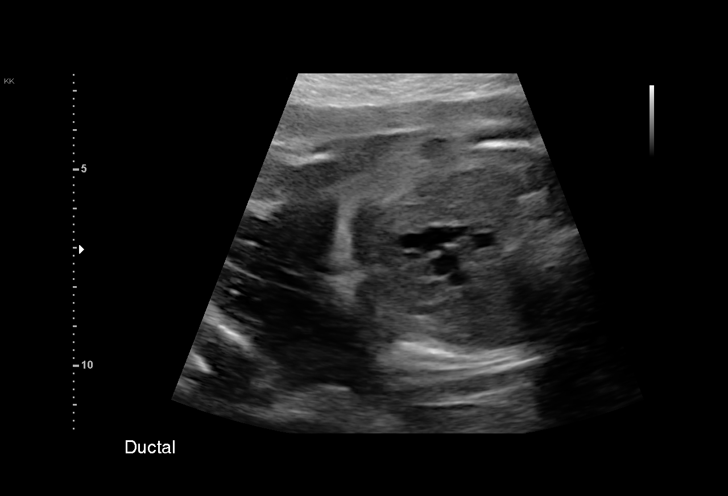
[im 22/82]
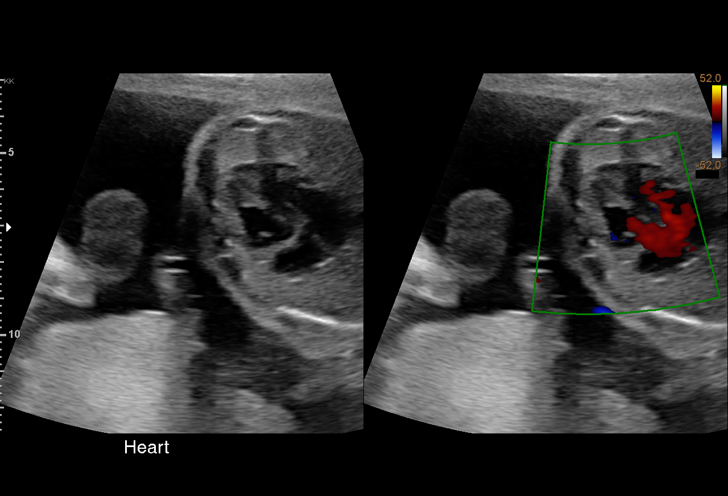
[im 28/82]
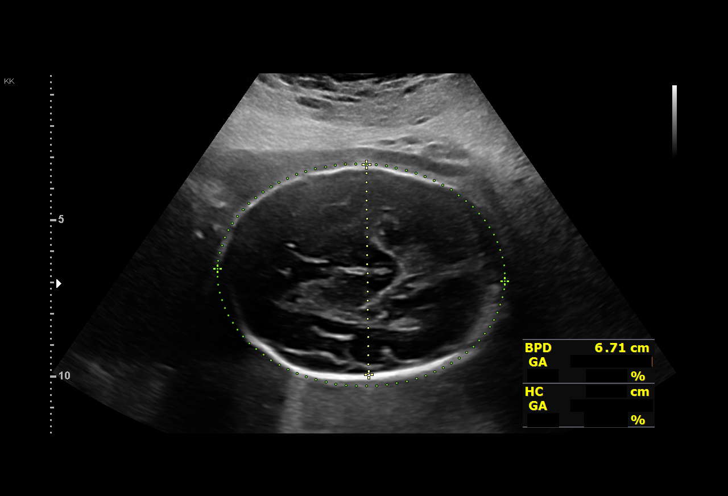
[im 34/82]
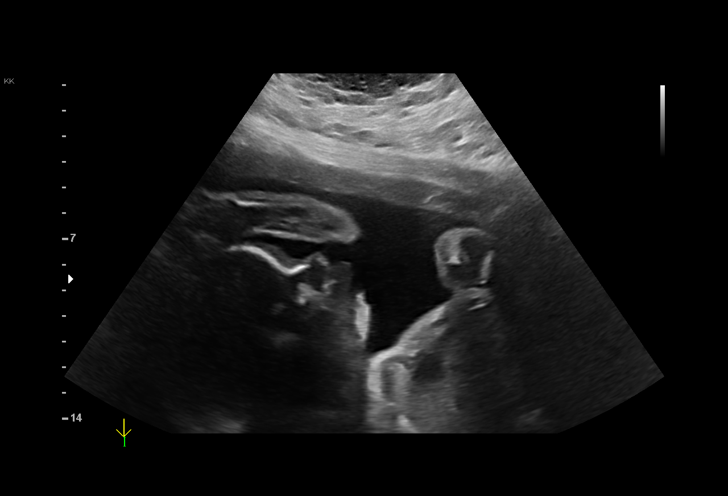
[im 43/82]
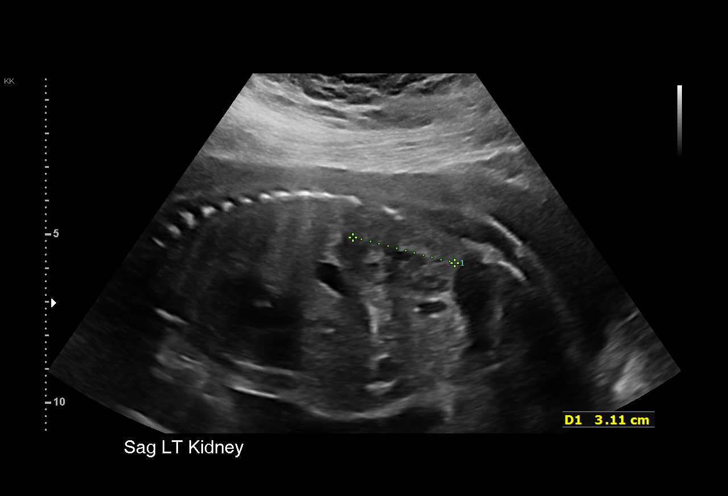
[im 49/82]
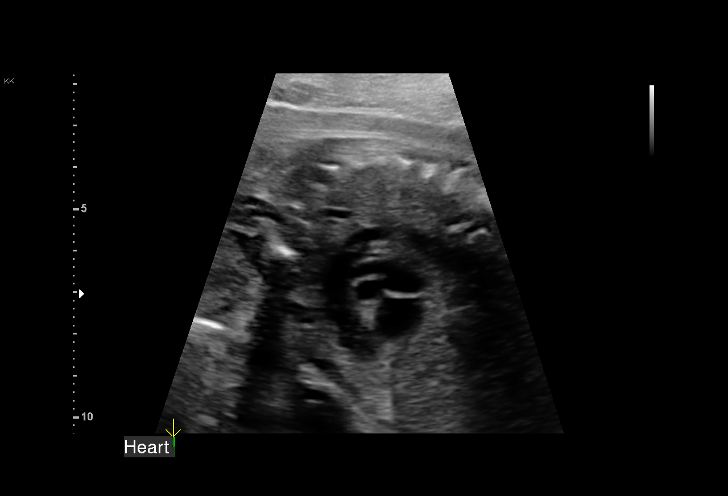
[im 55/82]
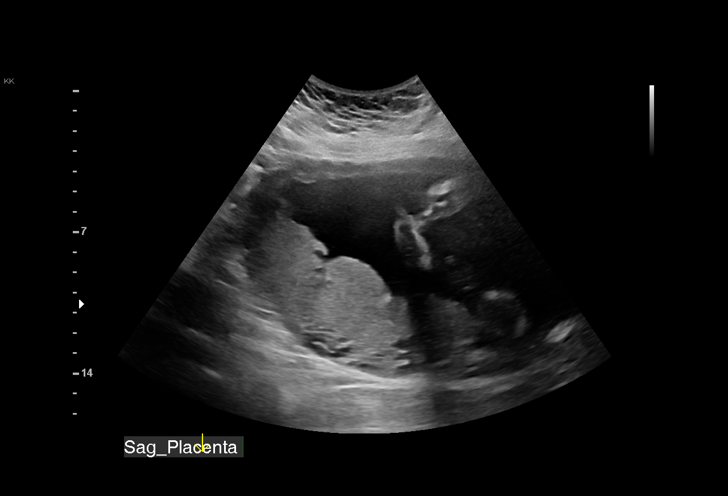
[im 61/82]
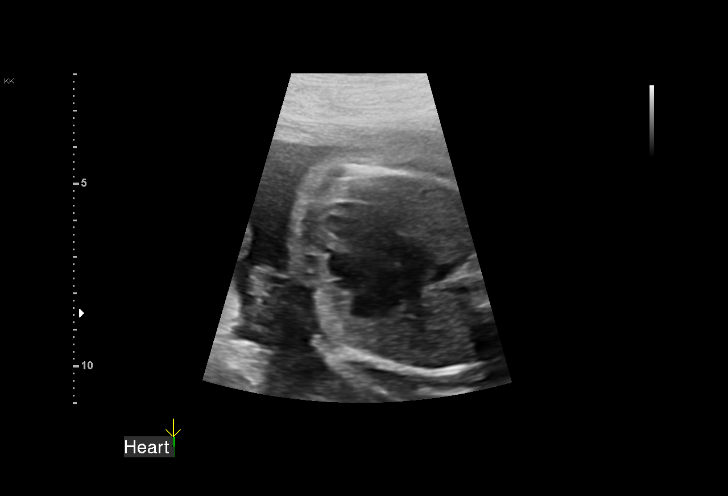
[im 67/82]
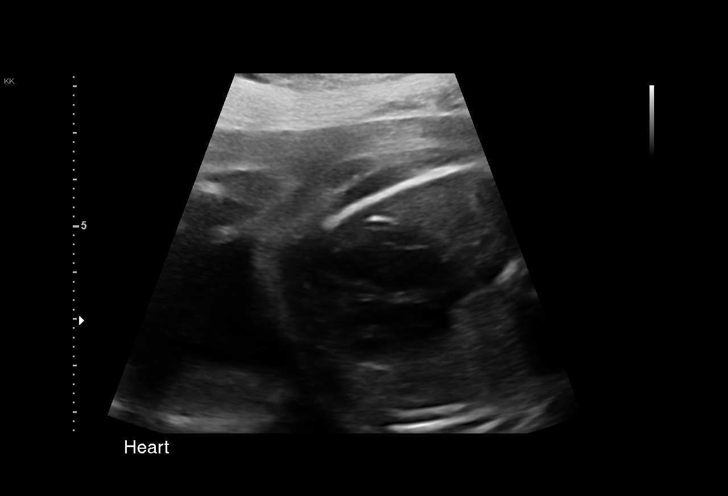
[im 73/82]
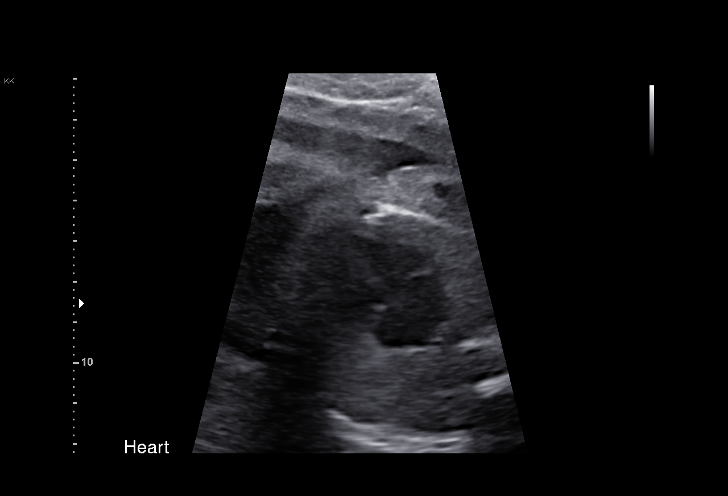
[im 79/82]
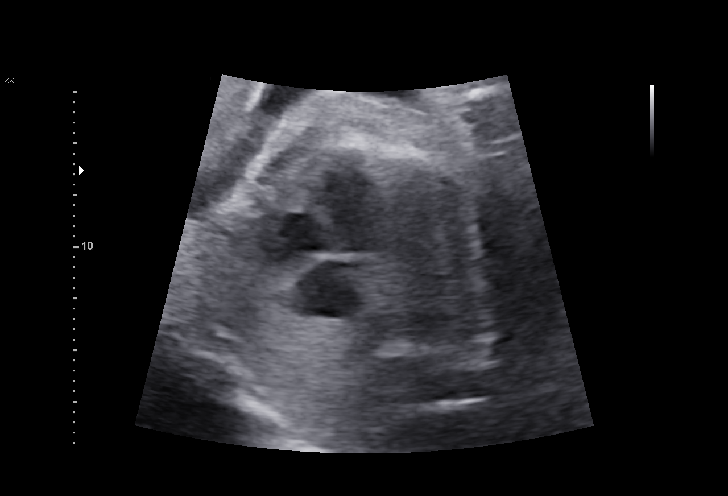

[13 of 28 positions shown; findings below may reference images not displayed]

[facility] NP                              [HOSPITAL] at
 Ref. Address:     Faculty Practice

Indications

 Obesity complicating pregnancy, second
 trimester (Pregravid BMI 42)
 Genetic carrier (Galactosemia Carrier)
 Low Risk NIPS(Negative AFP)
 Antenatal follow-up for nonvisualized fetal
 anatomy
 27 weeks gestation of pregnancy
Vital Signs

                                                Height:        5'4"
Fetal Evaluation

 Num Of Fetuses:         1
 Fetal Heart Rate(bpm):  148
 Cardiac Activity:       Observed
 Presentation:           Breech
 Placenta:               Posterior Fundal
 P. Cord Insertion:      Previously Visualized

 Amniotic Fluid
 AFI FV:      Within normal limits

                             Largest Pocket(cm)

Biometry

 BPD:      67.6  mm     G. Age:  27w 2d         31  %    CI:        69.43   %    70 - 86
                                                         FL/HC:      19.5   %    18.6 -
 HC:       259   mm     G. Age:  28w 1d         44  %    HC/AC:      1.07        1.05 -
 AC:      242.3  mm     G. Age:  28w 3d         74  %    FL/BPD:     74.6   %    71 - 87
 FL:       50.4  mm     G. Age:  27w 0d         25  %    FL/AC:      20.8   %    20 - 24

 Est. FW:    3316  gm      2 lb 8 oz     56  %
OB History

 Gravidity:    2         Term:   1        Prem:   0        SAB:   0
 TOP:          0       Ectopic:  0        Living: 1
Gestational Age

 LMP:           25w 1d        Date:  10/20/19                 EDD:   07/26/20
 U/S Today:     27w 5d                                        EDD:   07/08/20
 Best:          27w 3d     Det. By:  Early Ultrasound         EDD:   07/10/20
                                     (12/19/19)
Anatomy

 Cranium:               Appears normal         Aortic Arch:            Appears normal
 Cavum:                 Appears normal         Ductal Arch:            Appears normal
 Ventricles:            Appears normal         Diaphragm:              Appears normal
 Choroid Plexus:        Previously seen        Stomach:                Appears normal, left
                                                                       sided
 Cerebellum:            Appears normal         Abdomen:                Appears normal
 Posterior Fossa:       Previously seen        Abdominal Wall:         Previously seen
 Nuchal Fold:           Not applicable (>20    Cord Vessels:           Previously seen
                        wks GA)
 Face:                  Orbits and profile     Kidneys:                Appear normal
                        previously seen
 Lips:                  Appears normal         Bladder:                Appears normal
 Thoracic:              Appears normal         Spine:                  Previously seen
 Heart:                 Not well visualized    Upper Extremities:      Previously seen
 RVOT:                  Not well visualized    Lower Extremities:      Previously seen
 LVOT:                  Not well visualized

 Other:  Fetus a male. Technically difficult due to maternal habitus and fetal
         position.
Cervix Uterus Adnexa

 Cervix
 Not visualized (advanced GA >73wks)
Comments

 This patient was seen for a follow up growth scan due to
 maternal obesity.  She denies any problems since her last
 exam.
 She was informed that the fetal growth and amniotic fluid
 level appears appropriate for her gestational age.
 Due to maternal body habitus and the fetal position, the views
 of the fetal heart remain suboptimal today.
 A follow up exam was scheduled in 4 weeks.  We will attempt
 to assess the fetal cardiac views again during her next
 ultrasound exam.

## 2021-01-13 IMAGING — US US MFM FETAL BPP W/O NON-STRESS
1 series · 12 of 28 positions shown · non-contrast
Comparison: none

[Series 1: us mfm fetal bpp w/o non-stress · 32 acquisitions, 12 frames shown]
[im 2/32]
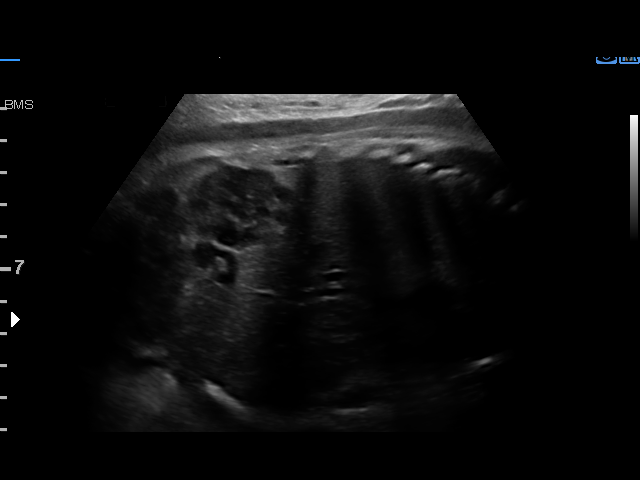
[im 4/32]
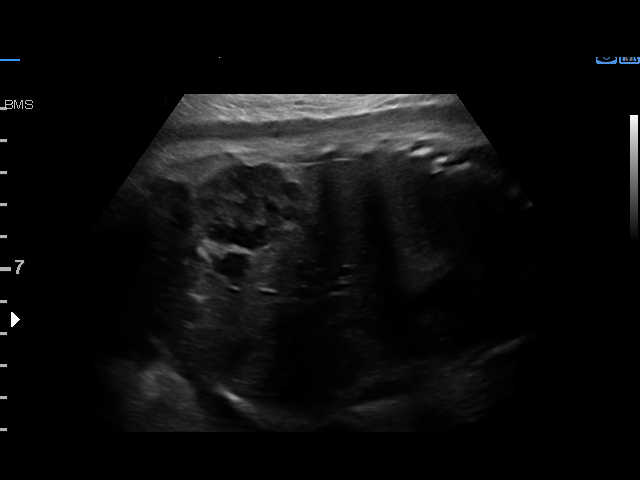
[im 6/32]
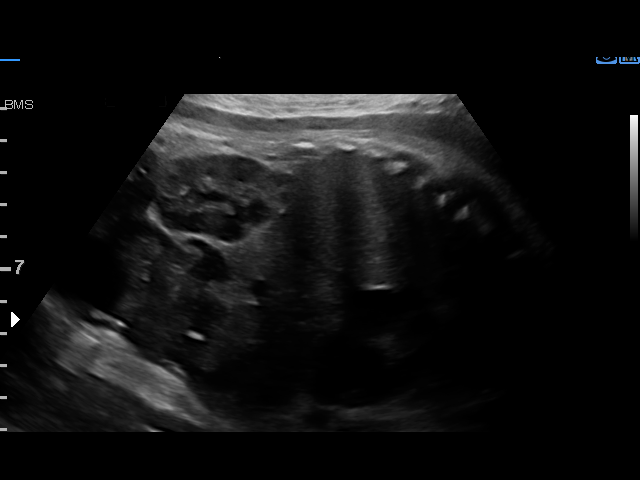
[im 10/32]
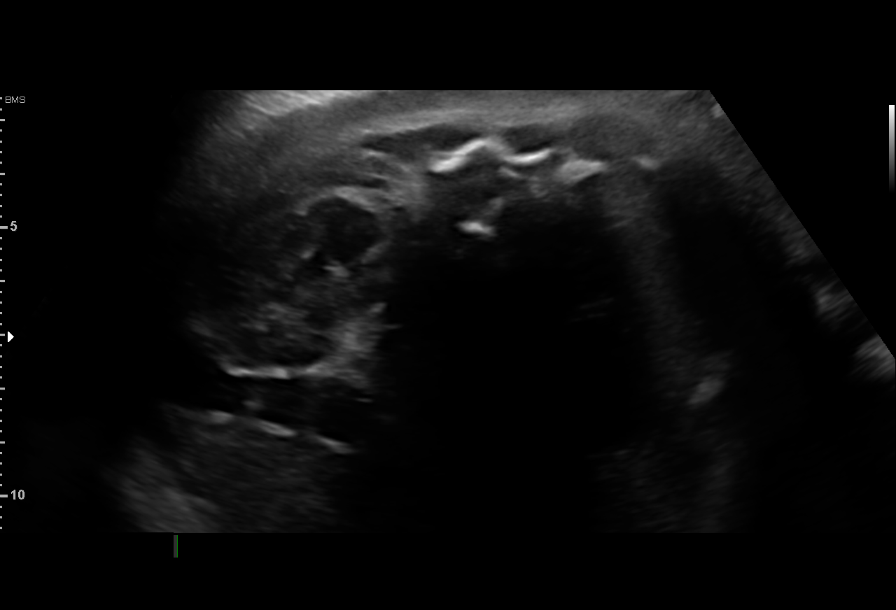
[im 12/32]
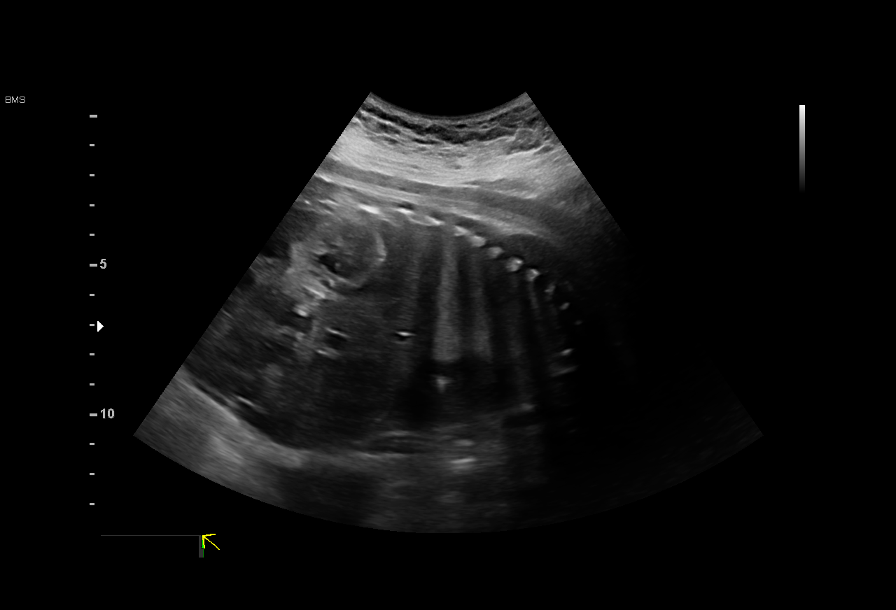
[im 14/32]
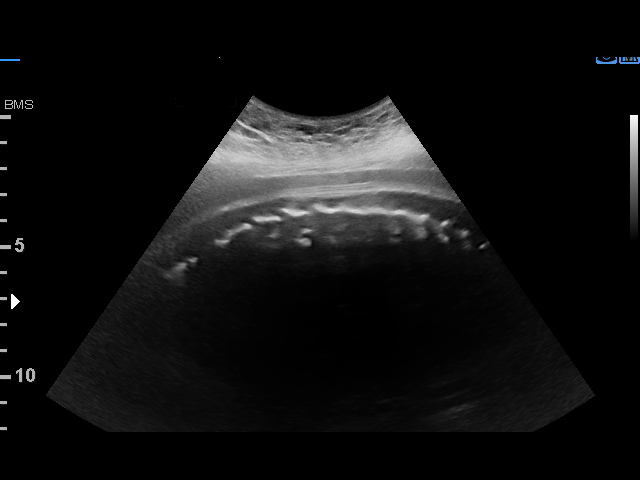
[im 18/32]
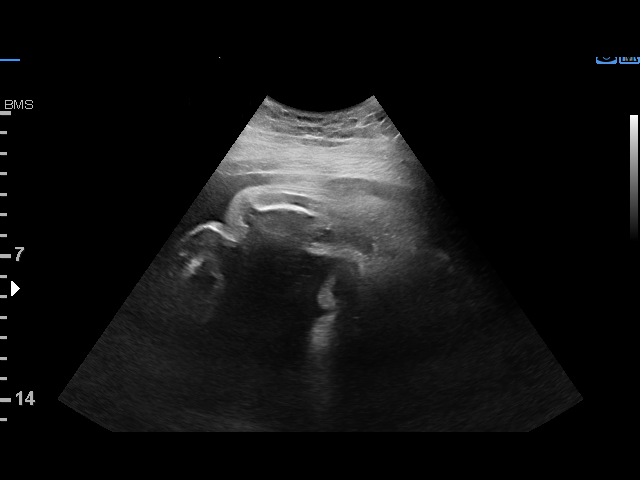
[im 20/32]
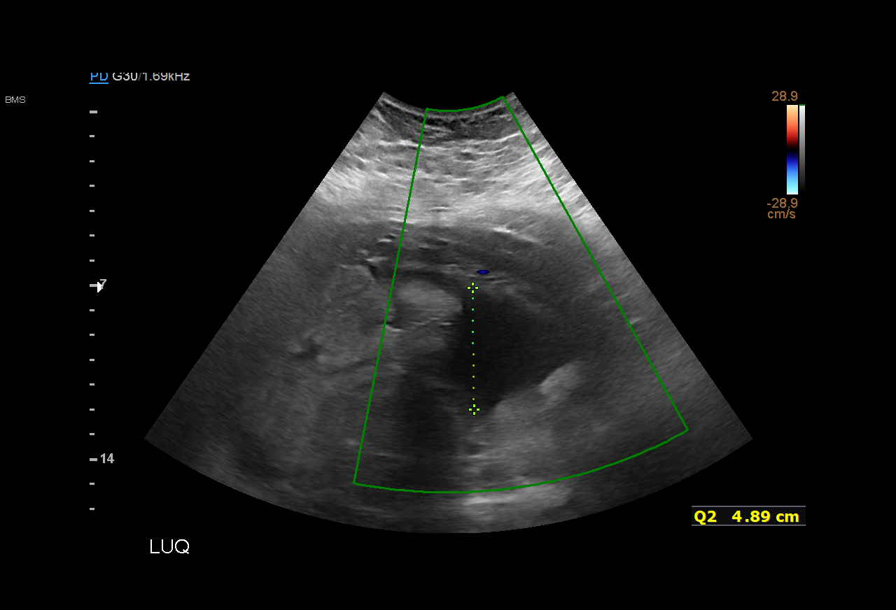
[im 22/32]
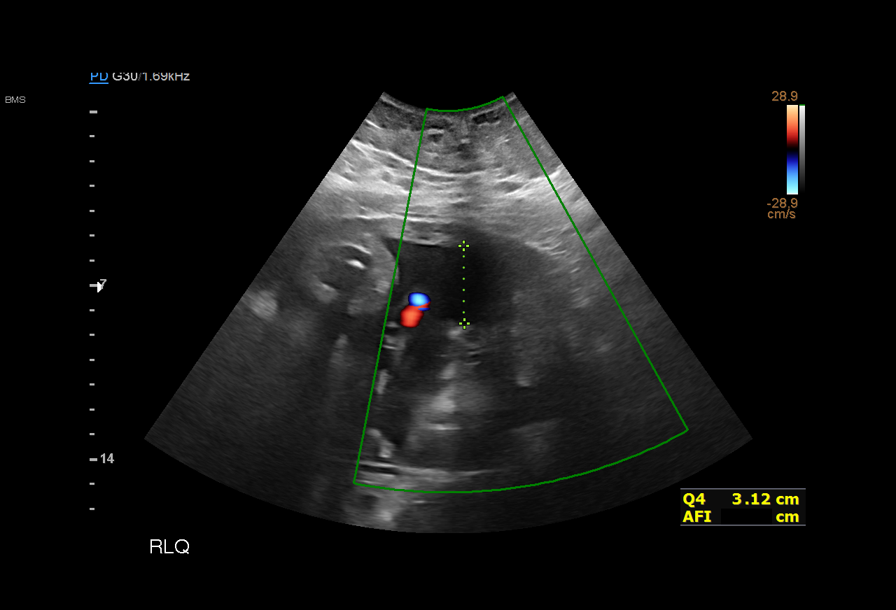
[im 26/32]
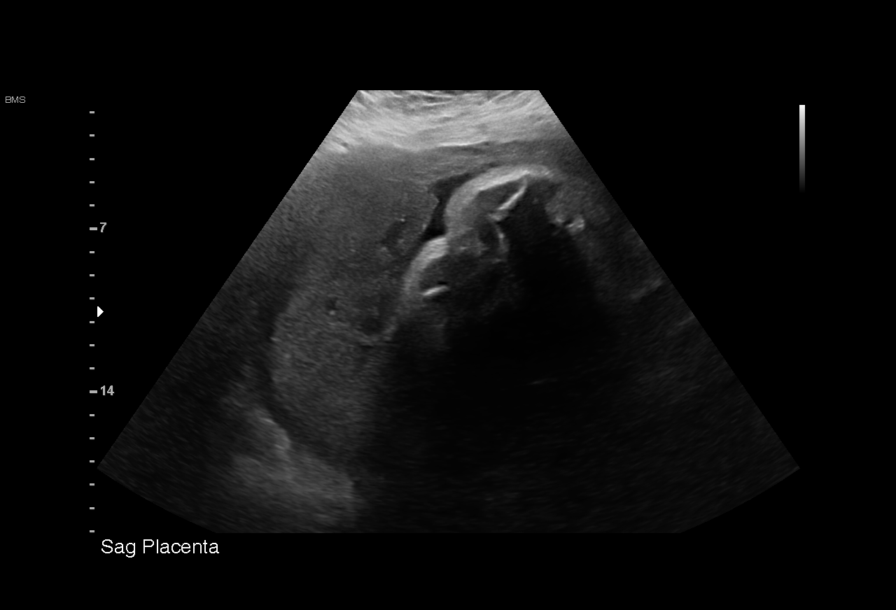
[im 28/32]
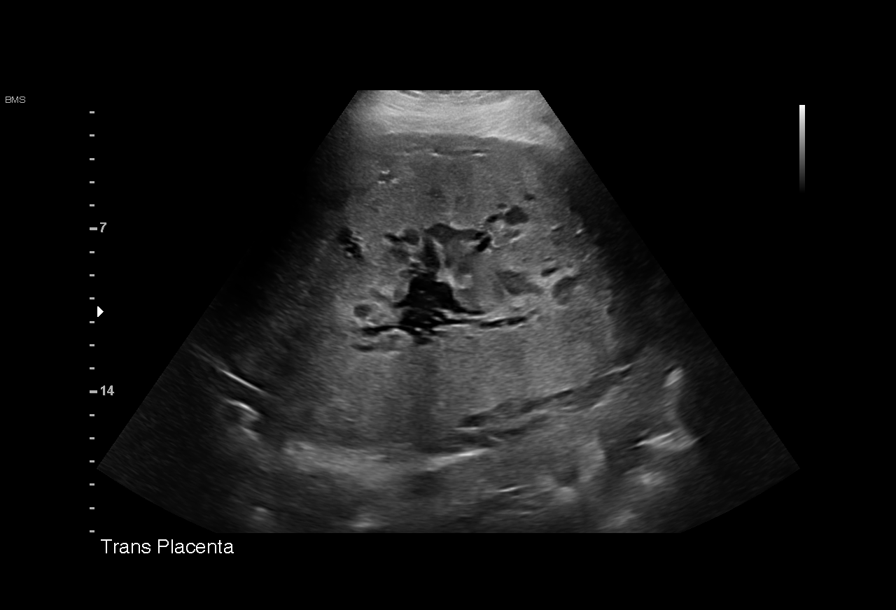
[im 30/32]
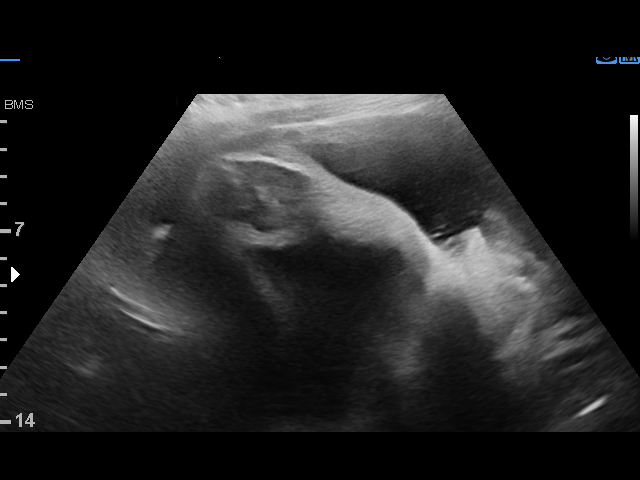

[12 of 28 positions shown; findings below may reference images not displayed]

[facility] NP                              [HOSPITAL] at
 Ref. Address:     Faculty Practice

Indications

 Obesity complicating pregnancy, third
 trimester (pregravid BMI 42)
 Genetic carrier (Galactosemia Carrier)
 Low Risk NIPS(Negative AFP)
 Prior poor obstetrical history antepartum,
 unspecified trimester (gestational
 hypertension)
 36 weeks gestation of pregnancy
Vital Signs

                                                Height:        5'4"
Fetal Evaluation

 Num Of Fetuses:         1
 Fetal Heart Rate(bpm):  157
 Cardiac Activity:       Observed
 Presentation:           Cephalic
 Placenta:               Posterior Fundal
 P. Cord Insertion:      Visualized, central

 Amniotic Fluid
 AFI FV:      Within normal limits

 AFI Sum(cm)     %Tile       Largest Pocket(cm)
 15.13           57
 RUQ(cm)       RLQ(cm)       LUQ(cm)        LLQ(cm)

Biophysical Evaluation

 Amniotic F.V:   Pocket => 2 cm             F. Tone:        Observed
 F. Movement:    Observed                   Score:          [DATE]
 F. Breathing:   Observed
OB History

 Gravidity:    2         Term:   1        Prem:   0        SAB:   0
 TOP:          0       Ectopic:  0        Living: 1
Gestational Age

 LMP:           34w 4d        Date:  10/20/19                 EDD:   07/26/20
 Best:          36w 6d     Det. By:  Early Ultrasound         EDD:   07/10/20
                                     (12/19/19)
Anatomy

 Diaphragm:             Appears normal         Bladder:                Appears normal
 Kidneys:               Appear normal
Cervix Uterus Adnexa

 Cervix
 Not visualized (advanced GA >71wks)
Impression

 Maternal obesity.  Patient return for antenatal testing.

 Amniotic fluid is normal and good fetal activity is seen
 .Antenatal testing is reassuring. BPP [DATE].
Recommendations

 -Continue weekly BPP till delivery.
                 Freund, [place]

## 2023-02-19 ENCOUNTER — Telehealth (INDEPENDENT_AMBULATORY_CARE_PROVIDER_SITE_OTHER): Payer: Medicaid Other

## 2023-02-19 DIAGNOSIS — Z3480 Encounter for supervision of other normal pregnancy, unspecified trimester: Secondary | ICD-10-CM

## 2023-02-19 DIAGNOSIS — O0990 Supervision of high risk pregnancy, unspecified, unspecified trimester: Secondary | ICD-10-CM | POA: Insufficient documentation

## 2023-02-19 MED ORDER — BLOOD PRESSURE KIT DEVI: 1.0000 | 0 refills | Status: DC | PRN

## 2023-02-19 NOTE — Patient Instructions (Signed)
Safe Medications in Pregnancy   Acne:  Benzoyl Peroxide  Salicylic Acid   Backache/Headache:  Tylenol: 2 regular strength every 4 hours OR               2 Extra strength every 6 hours   Colds/Coughs/Allergies:  Benadryl (alcohol free) 25 mg every 6 hours as needed  Breath right strips  Claritin  Cepacol throat lozenges  Chloraseptic throat spray  Cold-Eeze- up to three times per day  Cough drops, alcohol free  Flonase (by prescription only)  Guaifenesin  Mucinex  Robitussin DM (plain only, alcohol free)  Saline nasal spray/drops  Sudafed (pseudoephedrine) & Actifed * use only after [redacted] weeks gestation and if you do not have high blood pressure  Tylenol  Vicks Vaporub  Zinc lozenges  Zyrtec   Constipation:  Colace  Ducolax suppositories  Fleet enema  Glycerin suppositories  Metamucil  Milk of magnesia  Miralax  Senokot  Smooth move tea   Diarrhea:  Kaopectate  Imodium A-D   *NO pepto Bismol   Hemorrhoids:  Anusol  Anusol HC  Preparation H  Tucks   Indigestion:  Tums  Maalox  Mylanta  Zantac  Pepcid   Insomnia:  Benadryl (alcohol free) 25mg every 6 hours as needed  Tylenol PM  Unisom, no Gelcaps   Leg Cramps:  Tums  MagGel   Nausea/Vomiting:  Bonine  Dramamine  Emetrol  Ginger extract  Sea bands  Meclizine  Nausea medication to take during pregnancy:  Unisom (doxylamine succinate 25 mg tablets) Take one tablet daily at bedtime. If symptoms are not adequately controlled, the dose can be increased to a maximum recommended dose of two tablets daily (1/2 tablet in the morning, 1/2 tablet mid-afternoon and one at bedtime).  Vitamin B6 100mg tablets. Take one tablet twice a day (up to 200 mg per day).   Skin Rashes:  Aveeno products  Benadryl cream or 25mg every 6 hours as needed  Calamine Lotion  1% cortisone cream   Yeast infection:  Gyne-lotrimin 7  Monistat 7    **If taking multiple medications, please check labels to avoid  duplicating the same active ingredients  **take medication as directed on the label  ** Do not exceed 4000 mg of tylenol in 24 hours  **Do not take medications that contain aspirin or ibuprofen             Considering Waterbirth? Guide for patients at [facility] ([facility]) Why consider waterbirth? Gentle birth for babies  Less pain medicine used in labor  May allow for passive descent/less pushing  May reduce perineal tears  More mobility and instinctive maternal position changes  Increased maternal relaxation   Is waterbirth safe? What are the risks of infection, drowning or other complications? Infection:  Very low risk (3.7 % for tub vs 4.8% for bed)  7 in 8000 waterbirths with documented infection  Poorly cleaned equipment most common cause  Slightly lower group B strep transmission rate  Drowning  Maternal:  Very low risk  Related to seizures or fainting  Newborn:  Very low risk. No evidence of increased risk of respiratory problems in multiple large studies  Physiological protection from breathing under water  Avoid underwater birth if there are any fetal complications  Once baby's head is out of the water, keep it out.  Birth complication  Some reports of cord trauma, but risk decreased by bringing baby to surface gradually  No evidence of increased risk of shoulder dystocia.   Mothers can usually change positions faster in water than in a bed, possibly aiding the maneuvers to free the shoulder.   There are 2 things you MUST do to have a waterbirth with [facility]: Attend a waterbirth class at [facility] [place]   3rd Wednesday of every month from 7-9 pm (virtual during COVID) Free Register online at [email or web address] or [email or web address] or by calling [phone] Bring us the certificate from the class to your prenatal appointment or send via MyChart Meet with a midwife at 36 weeks* to see if you can still plan a  waterbirth and to sign the consent.   *We also recommend that you schedule as many of your prenatal visits with a midwife as possible.    Helpful information: You may want to bring a bathing suit top to the hospital to wear during labor but this is optional.  All other supplies are provided by the hospital. Please arrive at the hospital with signs of active labor, and do not wait at home until late in labor. It takes 45 min- 1 hour for fetal monitoring, and check in to your room to take place, plus transport and filling of the waterbirth tub.    Things that would prevent you from having a waterbirth: Premature, <37wks  Previous cesarean birth  Presence of thick meconium-stained fluid  Multiple gestation (Twins, triplets, etc.)  Uncontrolled diabetes or gestational diabetes requiring medication  Hypertension diagnosed in pregnancy or preexisting hypertension (gestational hypertension, preeclampsia, or chronic hypertension) Fetal growth restriction (your baby measures less than 10th percentile on ultrasound) Heavy vaginal bleeding  Non-reassuring fetal heart rate  Active infection (MRSA, etc.). Group B Strep is NOT a contraindication for waterbirth.  If your labor has to be induced and induction method requires continuous monitoring of the baby's heart rate  Other risks/issues identified by your obstetrical provider   Please remember that birth is unpredictable. Under certain unforeseeable circumstances your provider may advise against giving birth in the tub. These decisions will be made on a case-by-case basis and with the safety of you and your baby as our highest priority.     

## 2023-02-19 NOTE — Progress Notes (Signed)
New OB Intake  I connected with Theresa Tran  on 02/19/23 at  8:15 AM EDT by MyChart Video Visit and verified that I am speaking with the correct person using two identifiers. Nurse is located at [facility] and pt is located at home.  I discussed the limitations, risks, security and privacy concerns of performing an evaluation and management service by telephone and the availability of in person appointments. I also discussed with the patient that there may be a patient responsible charge related to this service. The patient expressed understanding and agreed to proceed.  I explained I am completing New OB Intake today. We discussed EDD of 08/19/2023, by Last Menstrual Period. Pt is G3P2002. I reviewed her allergies, medications and Medical/Surgical/OB history.    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 Discussed the nature of our practice with multiple providers including residents and students. Due to the size of the practice, the delivering provider may not be the same as those providing prenatal care.   Patient is interested in water birth. Offered upcoming OB visit with CNM to discuss further.  MyChart/Babyscripts MyChart access verified. I explained pt will have some visits in office and some virtually. Babyscripts instructions given and order placed. Patient verifies receipt of registration text/e-mail. Account successfully created and app downloaded.  Blood Pressure Cuff/Weight Scale Blood pressure cuff ordered for patient to pick-up from [employer]. Explained after first prenatal appt pt will check  weekly and document in Babyscripts.  Anatomy US Explained first scheduled [place] will be around 19 weeks. Anatomy US scheduled for 10/16/ 2024 at 12:15pm.  Is patient a CenteringPregnancy candidate?  Declined Declined due to Declined to say   Is patient a Mom+Baby Combined Care candidate?  Not a candidate   If accepted, confirm patient does not intend to move from the area for at least 12 months, then notify Mom+Baby staff  Interested in [place]? If yes, send referral and doula dot phrase.   Is patient a candidate for Babyscripts Optimization? no  First visit review I reviewed new OB appt with patient. Explained pt will be seen by Dr. Macon Large at first visit. Discussed [name] genetic screening with patient.  Panorama and Horizon.. Routine prenatal labs is needed at new ob visit.  Last Pap No results found for: "DIAGPAP"  Lowry Bowl, CMA 02/19/2023  8:12 AM

## 2023-02-26 ENCOUNTER — Other Ambulatory Visit: Payer: Self-pay

## 2023-02-26 ENCOUNTER — Encounter: Payer: Self-pay | Admitting: Obstetrics & Gynecology

## 2023-02-26 ENCOUNTER — Ambulatory Visit (INDEPENDENT_AMBULATORY_CARE_PROVIDER_SITE_OTHER): Payer: Medicaid Other | Admitting: Obstetrics & Gynecology

## 2023-02-26 ENCOUNTER — Ambulatory Visit: Payer: Medicaid Other

## 2023-02-26 ENCOUNTER — Other Ambulatory Visit (HOSPITAL_COMMUNITY)
Admission: RE | Admit: 2023-02-26 | Discharge: 2023-02-26 | Disposition: A | Discharge status: Home / Self Care | Payer: Medicaid Other | Source: Ambulatory Visit | Attending: Obstetrics & Gynecology | Admitting: Obstetrics & Gynecology

## 2023-02-26 VITALS — BP 110/63 | HR 86 | Wt 276.2 lb

## 2023-02-26 DIAGNOSIS — Z3482 Encounter for supervision of other normal pregnancy, second trimester: Secondary | ICD-10-CM | POA: Diagnosis not present

## 2023-02-26 DIAGNOSIS — Z3A21 21 weeks gestation of pregnancy: Secondary | ICD-10-CM

## 2023-02-26 DIAGNOSIS — O0990 Supervision of high risk pregnancy, unspecified, unspecified trimester: Secondary | ICD-10-CM

## 2023-02-26 DIAGNOSIS — Z3480 Encounter for supervision of other normal pregnancy, unspecified trimester: Secondary | ICD-10-CM

## 2023-02-26 DIAGNOSIS — Z8759 Personal history of other complications of pregnancy, childbirth and the puerperium: Secondary | ICD-10-CM | POA: Diagnosis not present

## 2023-02-26 DIAGNOSIS — O99210 Obesity complicating pregnancy, unspecified trimester: Secondary | ICD-10-CM | POA: Diagnosis not present

## 2023-02-26 MED ORDER — ASPIRIN 81 MG PO TBEC
81.0000 mg | DELAYED_RELEASE_TABLET | Freq: Every day | ORAL | 2 refills | Status: DC
Start: 2023-02-26 — End: 2023-03-23

## 2023-02-26 NOTE — Progress Notes (Signed)
History:   Theresa Tran is a 22 y.o. G3P2002 at [redacted]w[redacted]d by ultrasound done today not consistent with LMP being seen today for her first obstetrical visit.  Her obstetrical history is significant for  gestational hypertension for last two pregnancies, had term vaginal deliveries . Pregnancy history fully reviewed.  Patient reports no complaints.     HISTORY: OB History  Gravida Para Term Preterm AB Living  3 2 2  0 0 2  SAB IAB Ectopic Multiple Live Births  0 0 0 0 2    # Outcome Date GA Lbr Len/2nd Weight Sex Type Anes PTL Lv  3 Current           2 Term 07/07/20 [redacted]w[redacted]d 12:00 / 00:08 8 lb 1.3 oz (3.666 kg) M Vag-Spont EPI  LIV     Name: MACIAS-VELEZ,BOY Victoria     Apgar1: 8  Apgar5: 9  1 Term 07/19/17 [redacted]w[redacted]d 12:19 / 02:01 8 lb 5.7 oz (3.79 kg) F Vag-Spont EPI  LIV     Name: MACIAS-VELEZ,GIRL Marshe     Apgar1: 8  Apgar5: 9   Past Medical History:  Diagnosis Date   Gestational hypertension    Both both of her pregnancies   Past Surgical History:  Procedure Laterality Date   NO PAST SURGERIES     No family history on file. Social History   Tobacco Use   Smoking status: Former    Current packs/day: 0.00    Types: Cigarettes    Quit date: 07/10/2016    Years since quitting: 6.6   Smokeless tobacco: Never  Vaping Use   Vaping status: Former  Substance Use Topics   Alcohol use: No   Drug use: No   No Known Allergies Current Outpatient Medications on File Prior to Visit  Medication Sig Dispense Refill   acetaminophen (TYLENOL) 325 MG tablet Take 2 tablets (650 mg total) by mouth every 4 (four) hours as needed (for pain scale < 4).     Blood Pressure Monitoring (BLOOD PRESSURE KIT) DEVI 1 each by Does not apply route as needed. 1 each 0   Prenatal Vit-Fe Fumarate-FA (PRENATAL MULTIVITAMIN) TABS tablet Take 1 tablet by mouth daily at 12 noon.     No current facility-administered medications on file prior to visit.   Review of Systems Pertinent items noted in HPI  and remainder of comprehensive ROS otherwise negative.  Physical Exam:   Vitals:   02/26/23 1427  BP: 110/63  Pulse: 86  Weight: 276 lb 3.2 oz (125.3 kg)   Fetal Heart Rate (bpm): 146   General: well-developed, well-nourished female in no acute distress  Breasts:  normal appearance, no masses or tenderness bilaterally, exam done in the presence of a chaperone.   Skin: normal coloration and turgor, no rashes  Neurologic: oriented, normal, negative, normal mood  Extremities: normal strength, tone, and muscle mass, ROM of all joints is normal  HEENT PERRLA, extraocular movement intact and sclera clear, anicteric  Neck supple and no masses  Cardiovascular: regular rate and rhythm  Respiratory:  no respiratory distress, normal breath sounds  Abdomen: soft, non-tender; bowel sounds normal; no masses,  no organomegaly  Pelvic: normal external genitalia, no lesions, normal vaginal mucosa, normal vaginal discharge, normal cervix, pap smear done. Significant bleeding from transformation zone after pap, had to use silver nitrate to ameliorate this as pressure with fox swabs was not enough.  Exam done in the presence of a chaperone.     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 History of gestational hypertension Surveillance labs done. ASA prescribed for prophylaxis against preeclampsia. Will monitor BP closely. - Comprehensive metabolic panel - Protein / creatinine ratio, urine - aspirin EC 81 MG tablet; Take 1 tablet (81 mg total) by mouth at bedtime. Start taking when you are [redacted] weeks pregnant for rest of pregnancy for prevention of preeclampsia  Dispense: 300 tablet; Refill: 2 - AMB Referral to Cardio  Obstetrics  2. Maternal morbid obesity, antepartum (HCC) Surveillance labs done. ASA prescribed for prophylaxis against preeclampsia.  Serial growth scans, antenatal testing later in pregnancy as per protocol. - Comprehensive metabolic panel - Protein / creatinine ratio, urine - TSH Rfx on Abnormal to Free T4 - Hemoglobin A1c - aspirin EC 81 MG tablet; Take 1 tablet (81 mg total) by mouth at bedtime. Start taking when you are [redacted] weeks pregnant for rest of pregnancy for prevention of preeclampsia  Dispense: 300 tablet; Refill: 2 - AMB Referral to Cardio Obstetrics  3. [redacted] weeks gestation of pregnancy 4. Supervision of high risk pregnancy, antepartum - Cytology - PAP - PANORAMA PRENATAL TEST - Culture, OB Urine - CBC/D/Plt+RPR+Rh+ABO+RubIgG... - AFP, Serum, Open Spina Bifida Initial labs drawn. Continue prenatal vitamins. Problem list reviewed and updated. Genetic Screening discussed, Panorama ordered. Ultrasound discussed; fetal anatomic survey: scheduled. Anticipatory guidance about prenatal visits given including labs, ultrasounds, and testing. Weight gain recommendations per IOM guidelines reviewed: underweight/BMI 18.5 or less > 28 - 40 lbs; normal weight/BMI 18.5 - 24.9 > 25 - 35 lbs; overweight/BMI 25 - 29.9 > 15 - 25 lbs; obese/BMI  30 or more > 11 - 20 lbs. Discussed usage of the Babys[employer]s app for more information about pregnancy, and to track blood pressures. Also discussed usage of virtual visits as additional source of managing and completing prenatal visits.  Patient was encouraged to use MyChart to review results, send requests, and have questions addressed.   The nature of Center for [facility] with multiple MDs and Advanced Practice Providers was explained to patient; also emphasized that residents, students are part of our team. Routine obstetric precautions reviewed. Encouraged to seek out care at our office or emergency room [facility] MAU preferred)  for urgent and/or emergent concerns. Return in about 4 weeks (around 03/26/2023) for OFFICE OB VISIT (MD only).     Jaynie Collins, MD, FACOG Obstetrician & Gynecologist, [facility] for [employer], [facility]

## 2023-02-27 LAB — CBC/D/PLT+RPR+RH+ABO+RUBIGG...
Antibody Screen: NEGATIVE
Basophils Absolute: 0.1 10*3/uL (ref 0.0–0.2)
Basos: 1 %
EOS (ABSOLUTE): 0.4 10*3/uL (ref 0.0–0.4)
Eos: 3 %
HCV Ab: NONREACTIVE
HIV Screen 4th Generation wRfx: NONREACTIVE
Hematocrit: 38.2 % (ref 34.0–46.6)
Hemoglobin: 12.8 g/dL (ref 11.1–15.9)
Hepatitis B Surface Ag: NEGATIVE
Immature Grans (Abs): 0.1 10*3/uL (ref 0.0–0.1)
Immature Granulocytes: 1 %
Lymphocytes Absolute: 2.5 10*3/uL (ref 0.7–3.1)
Lymphs: 23 %
MCH: 28.8 pg (ref 26.6–33.0)
MCHC: 33.5 g/dL (ref 31.5–35.7)
MCV: 86 fL (ref 79–97)
Monocytes Absolute: 0.5 10*3/uL (ref 0.1–0.9)
Monocytes: 4 %
Neutrophils Absolute: 7.5 10*3/uL — ABNORMAL HIGH (ref 1.4–7.0)
Neutrophils: 68 %
Platelets: 318 10*3/uL (ref 150–450)
RBC: 4.45 x10E6/uL (ref 3.77–5.28)
RDW: 13.4 % (ref 11.7–15.4)
RPR Ser Ql: NONREACTIVE
Rh Factor: POSITIVE
Rubella Antibodies, IGG: 1.03 index (ref >0.99)
WBC: 10.9 10*3/uL — ABNORMAL HIGH (ref 3.4–10.8)

## 2023-02-27 LAB — PROTEIN / CREATININE RATIO, URINE
Creatinine, Urine: 200.3 mg/dL
Protein, Ur: 10.5 mg/dL
Protein/Creat Ratio: 52 mg/g creat (ref 0–200)

## 2023-02-27 LAB — TSH RFX ON ABNORMAL TO FREE T4: TSH: 1.65 u[IU]/mL (ref 0.450–4.500)

## 2023-02-27 LAB — HCV INTERPRETATION

## 2023-02-28 LAB — URINE CULTURE, OB REFLEX

## 2023-02-28 LAB — CULTURE, OB URINE

## 2023-03-06 LAB — PANORAMA PRENATAL TEST FULL PANEL:PANORAMA TEST PLUS 5 ADDITIONAL MICRODELETIONS: FETAL FRACTION: 6.8

## 2023-03-06 LAB — CYTOLOGY - PAP
Chlamydia: NEGATIVE
Comment: NEGATIVE
Comment: NEGATIVE
Comment: NORMAL
Diagnosis: NEGATIVE
Neisseria Gonorrhea: NEGATIVE
Trichomonas: NEGATIVE

## 2023-03-10 LAB — AFP, SERUM, OPEN SPINA BIFIDA
AFP MoM: 0.85
AFP Value: 39.3 ng/mL
Gest. Age on Collection Date: 21.3 wk
Maternal Age At EDD: 22.2 a
OSBR Risk 1 IN: 10000
Test Results:: NEGATIVE
Weight: 276 [lb_av]

## 2023-03-10 LAB — COMPREHENSIVE METABOLIC PANEL
ALT: 19 [IU]/L (ref 0–32)
AST: 15 [IU]/L (ref 0–40)
Albumin: 4 g/dL (ref 4.0–5.0)
Alkaline Phosphatase: 120 [IU]/L (ref 44–121)
BUN/Creatinine Ratio: 9 (ref 9–23)
BUN: 5 mg/dL — ABNORMAL LOW (ref 6–20)
Bilirubin Total: 0.3 mg/dL (ref 0.0–1.2)
CO2: 16 mmol/L — ABNORMAL LOW (ref 20–29)
Calcium: 9.2 mg/dL (ref 8.7–10.2)
Chloride: 105 mmol/L (ref 96–106)
Creatinine, Ser: 0.55 mg/dL — ABNORMAL LOW (ref 0.57–1.00)
Globulin, Total: 2.7 g/dL (ref 1.5–4.5)
Glucose: 75 mg/dL (ref 70–99)
Potassium: 4.2 mmol/L (ref 3.5–5.2)
Sodium: 137 mmol/L (ref 134–144)
Total Protein: 6.7 g/dL (ref 6.0–8.5)
eGFR: 134 mL/min/{1.73_m2} (ref >59)

## 2023-03-10 LAB — HEMOGLOBIN A1C
Est. average glucose Bld gHb Est-mCnc: 103 mg/dL
Hgb A1c MFr Bld: 5.2 % (ref 4.8–5.6)

## 2023-03-23 ENCOUNTER — Ambulatory Visit (INDEPENDENT_AMBULATORY_CARE_PROVIDER_SITE_OTHER): Payer: Medicaid Other | Admitting: Family Medicine

## 2023-03-23 VITALS — BP 111/71 | HR 79 | Wt 296.6 lb

## 2023-03-23 DIAGNOSIS — O0992 Supervision of high risk pregnancy, unspecified, second trimester: Secondary | ICD-10-CM

## 2023-03-23 DIAGNOSIS — Z23 Encounter for immunization: Secondary | ICD-10-CM | POA: Diagnosis not present

## 2023-03-23 DIAGNOSIS — Z8759 Personal history of other complications of pregnancy, childbirth and the puerperium: Secondary | ICD-10-CM

## 2023-03-23 DIAGNOSIS — O0990 Supervision of high risk pregnancy, unspecified, unspecified trimester: Secondary | ICD-10-CM

## 2023-03-23 DIAGNOSIS — O99212 Obesity complicating pregnancy, second trimester: Secondary | ICD-10-CM

## 2023-03-23 DIAGNOSIS — Z3A24 24 weeks gestation of pregnancy: Secondary | ICD-10-CM

## 2023-03-23 MED ORDER — PRENATAL MULTIVITAMIN CH
1.0000 | ORAL_TABLET | Freq: Every day | ORAL | 12 refills | Status: DC
Start: 2023-03-23 — End: 2023-04-21

## 2023-03-23 MED ORDER — PRENATAL MULTIVITAMIN CH: 1.0000 | ORAL_TABLET | Freq: Every day | ORAL | 12 refills | Status: DC

## 2023-03-23 MED ORDER — ASPIRIN 81 MG PO TBEC
81.0000 mg | DELAYED_RELEASE_TABLET | Freq: Every day | ORAL | 2 refills | Status: DC
Start: 2023-03-23 — End: 2023-07-06

## 2023-03-23 NOTE — Progress Notes (Signed)
   PRENATAL VISIT NOTE  Subjective:  Theresa Tran is a 22 y.o. G3P2002 at [redacted]w[redacted]d being seen today for ongoing prenatal care.  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  Patient reports no complaints.  Contractions: Not present. Vag. Bleeding: None.  Movement: Present. Denies leaking of fluid.   The following portions of the patient's history were reviewed and updated as appropriate: allergies, current medications, past family history, past medical history, past social history, past surgical history and problem list.   Objective:   Vitals:   03/23/23 1506  BP: 111/71  Pulse: 79  Weight: 296 lb 9.6 oz (134.5 kg)    Fetal Status: Fetal Heart Rate (bpm): 142 Fundal Height: 25 cm Movement: Present     General:  Alert, oriented and cooperative. Patient is in no acute distress.  Skin: Skin is warm and dry. No rash noted.   Cardiovascular: Normal heart rate noted  Respiratory: Normal respiratory effort, no problems with respiration noted  Abdomen: Soft, gravid, appropriate for gestational age.  Pain/Pressure: Absent     Pelvic: Cervical exam deferred        Extremities: Normal range of motion.     Mental Status: Normal mood and affect. Normal behavior. Normal judgment and thought content.   Assessment and Plan:  Pregnancy: G3P2002 at [redacted]w[redacted]d 1. Supervision of high risk pregnancy, antepartum Up to date FH appropriate No concerns today - Given Rx for prenatal and ASA - paper - Flu vaccine trivalent PF, 6mos and older(Flulaval,Afluria,Fluarix,Fluzone)  2. History of gestational hypertension BP WNL  3. Maternal morbid obesity, antepartum (HCC) TWG=18 lb 9.6 oz (8.437 kg)   Preterm labor symptoms and general obstetric precautions including  but not limited to vaginal bleeding, contractions, leaking of fluid and fetal movement were reviewed in detail with the patient. Please refer to After Visit Summary for other counseling recommendations.   Return in about 4 weeks (around 04/20/2023) for Routine prenatal care, 28 wk labs.  Future Appointments  Date Time Provider Department Center  03/25/2023 12:15 PM [facility] NURSE [facility]  03/25/2023 12:30 PM [facility] US5 [facility]  04/17/2023  1:40 PM Tobb, Lavona [place], DO [facility] None    Federico Flake, MD

## 2023-03-25 ENCOUNTER — Encounter: Payer: Self-pay | Admitting: *Deleted

## 2023-03-25 ENCOUNTER — Ambulatory Visit: Payer: Medicaid Other

## 2023-04-16 ENCOUNTER — Other Ambulatory Visit: Payer: Self-pay | Admitting: Obstetrics & Gynecology

## 2023-04-16 DIAGNOSIS — Z3480 Encounter for supervision of other normal pregnancy, unspecified trimester: Secondary | ICD-10-CM

## 2023-04-17 ENCOUNTER — Encounter: Payer: Self-pay | Admitting: *Deleted

## 2023-04-17 ENCOUNTER — Ambulatory Visit: Payer: Medicaid Other | Admitting: Cardiology

## 2023-04-17 ENCOUNTER — Ambulatory Visit (HOSPITAL_BASED_OUTPATIENT_CLINIC_OR_DEPARTMENT_OTHER): Payer: Medicaid Other

## 2023-04-17 ENCOUNTER — Other Ambulatory Visit: Payer: Self-pay | Admitting: *Deleted

## 2023-04-17 ENCOUNTER — Ambulatory Visit: Payer: Medicaid Other | Attending: Obstetrics and Gynecology | Admitting: *Deleted

## 2023-04-17 VITALS — BP 118/62 | HR 79

## 2023-04-17 DIAGNOSIS — O99213 Obesity complicating pregnancy, third trimester: Secondary | ICD-10-CM | POA: Insufficient documentation

## 2023-04-17 DIAGNOSIS — Z3480 Encounter for supervision of other normal pregnancy, unspecified trimester: Secondary | ICD-10-CM

## 2023-04-17 DIAGNOSIS — Z8759 Personal history of other complications of pregnancy, childbirth and the puerperium: Secondary | ICD-10-CM

## 2023-04-17 DIAGNOSIS — O09293 Supervision of pregnancy with other poor reproductive or obstetric history, third trimester: Secondary | ICD-10-CM | POA: Insufficient documentation

## 2023-04-17 DIAGNOSIS — O0990 Supervision of high risk pregnancy, unspecified, unspecified trimester: Secondary | ICD-10-CM

## 2023-04-17 DIAGNOSIS — O1493 Unspecified pre-eclampsia, third trimester: Secondary | ICD-10-CM | POA: Insufficient documentation

## 2023-04-17 DIAGNOSIS — Z363 Encounter for antenatal screening for malformations: Secondary | ICD-10-CM | POA: Diagnosis not present

## 2023-04-17 DIAGNOSIS — Z3A28 28 weeks gestation of pregnancy: Secondary | ICD-10-CM | POA: Diagnosis not present

## 2023-04-20 ENCOUNTER — Other Ambulatory Visit: Payer: Self-pay

## 2023-04-20 DIAGNOSIS — O0990 Supervision of high risk pregnancy, unspecified, unspecified trimester: Secondary | ICD-10-CM

## 2023-04-21 ENCOUNTER — Encounter: Payer: Self-pay | Admitting: Family Medicine

## 2023-04-21 ENCOUNTER — Other Ambulatory Visit: Payer: Self-pay

## 2023-04-21 ENCOUNTER — Other Ambulatory Visit: Payer: Medicaid Other

## 2023-04-21 ENCOUNTER — Ambulatory Visit (INDEPENDENT_AMBULATORY_CARE_PROVIDER_SITE_OTHER): Payer: Medicaid Other | Admitting: Family Medicine

## 2023-04-21 VITALS — BP 113/72 | HR 101 | Wt 264.7 lb

## 2023-04-21 DIAGNOSIS — O0993 Supervision of high risk pregnancy, unspecified, third trimester: Secondary | ICD-10-CM

## 2023-04-21 DIAGNOSIS — Z23 Encounter for immunization: Secondary | ICD-10-CM | POA: Diagnosis not present

## 2023-04-21 DIAGNOSIS — Z3A29 29 weeks gestation of pregnancy: Secondary | ICD-10-CM

## 2023-04-21 DIAGNOSIS — O0990 Supervision of high risk pregnancy, unspecified, unspecified trimester: Secondary | ICD-10-CM

## 2023-04-21 DIAGNOSIS — Z8759 Personal history of other complications of pregnancy, childbirth and the puerperium: Secondary | ICD-10-CM

## 2023-04-21 DIAGNOSIS — O99213 Obesity complicating pregnancy, third trimester: Secondary | ICD-10-CM

## 2023-04-21 MED ORDER — PRENATAL 28-0.8 MG PO TABS: 1.0000 | ORAL_TABLET | Freq: Every day | ORAL | 12 refills | Status: AC

## 2023-04-21 NOTE — Progress Notes (Signed)
   Subjective:  Theresa Tran is a 22 y.o. G3P2002 at [redacted]w[redacted]d being seen today for ongoing prenatal care.  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  Patient reports no complaints.  Contractions: Not present. Vag. Bleeding: None.  Movement: Present. Denies leaking of fluid.   The following portions of the patient's history were reviewed and updated as appropriate: allergies, current medications, past family history, past medical history, past social history, past surgical history and problem list. Problem list updated.  Objective:   Vitals:   04/21/23 0845  BP: 113/72  Pulse: (!) 101  Weight: 264 lb 11.2 oz (120.1 kg)    Fetal Status: Fetal Heart Rate (bpm): 132   Movement: Present     General:  Alert, oriented and cooperative. Patient is in no acute distress.  Skin: Skin is warm and dry. No rash noted.   Cardiovascular: Normal heart rate noted  Respiratory: Normal respiratory effort, no problems with respiration noted  Abdomen: Soft, gravid, appropriate for gestational age. Pain/Pressure: Absent     Pelvic: Vag. Bleeding: None     Cervical exam deferred        Extremities: Normal range of motion.  Edema: Trace  Mental Status: Normal mood and affect. Normal behavior. Normal judgment and thought content.   Urinalysis:      Assessment and Plan:  Pregnancy: G3P2002 at [redacted]w[redacted]d  1. [redacted] weeks gestation of pregnancy  - Tdap vaccine greater than or equal to 7yo IM  2. Supervision of high risk pregnancy, antepartum BP and FHR normal Third trimester labs today Accepts tdap Discussed contraception, still unsure, referred to bedsider  3. History of gestational hypertension On ASA, normotensive  4. Maternal morbid obesity, antepartum  (HCC) BPP starting at 34 weeks  Preterm labor symptoms and general obstetric precautions including but not limited to vaginal bleeding, contractions, leaking of fluid and fetal movement were reviewed in detail with the patient. Please refer to After Visit Summary for other counseling recommendations.  Return in 2 weeks (on 05/05/2023) for [facility], ob visit.   Theresa Maples, MD

## 2023-04-21 NOTE — Patient Instructions (Signed)

## 2023-04-22 LAB — GLUCOSE TOLERANCE, 2 HOURS W/ 1HR
Glucose, 1 hour: 157 mg/dL (ref 70–179)
Glucose, 2 hour: 105 mg/dL (ref 70–152)
Glucose, Fasting: 69 mg/dL — ABNORMAL LOW (ref 70–91)

## 2023-04-22 LAB — CBC
Hematocrit: 37.7 % (ref 34.0–46.6)
Hemoglobin: 12.4 g/dL (ref 11.1–15.9)
MCH: 28.9 pg (ref 26.6–33.0)
MCHC: 32.9 g/dL (ref 31.5–35.7)
MCV: 88 fL (ref 79–97)
Platelets: 304 10*3/uL (ref 150–450)
RBC: 4.29 x10E6/uL (ref 3.77–5.28)
RDW: 13 % (ref 11.7–15.4)
WBC: 11.2 10*3/uL — ABNORMAL HIGH (ref 3.4–10.8)

## 2023-04-22 LAB — RPR: RPR Ser Ql: NONREACTIVE

## 2023-04-22 LAB — HIV ANTIBODY (ROUTINE TESTING W REFLEX): HIV Screen 4th Generation wRfx: NONREACTIVE

## 2023-05-06 ENCOUNTER — Encounter: Payer: Self-pay | Admitting: Obstetrics & Gynecology

## 2023-05-06 ENCOUNTER — Ambulatory Visit: Payer: Medicaid Other | Admitting: Obstetrics & Gynecology

## 2023-05-06 ENCOUNTER — Other Ambulatory Visit: Payer: Self-pay

## 2023-05-06 VITALS — BP 119/72 | HR 72 | Wt 265.0 lb

## 2023-05-06 DIAGNOSIS — O99213 Obesity complicating pregnancy, third trimester: Secondary | ICD-10-CM

## 2023-05-06 DIAGNOSIS — O09293 Supervision of pregnancy with other poor reproductive or obstetric history, third trimester: Secondary | ICD-10-CM

## 2023-05-06 DIAGNOSIS — Z3A31 31 weeks gestation of pregnancy: Secondary | ICD-10-CM

## 2023-05-06 DIAGNOSIS — Z8759 Personal history of other complications of pregnancy, childbirth and the puerperium: Secondary | ICD-10-CM

## 2023-05-06 DIAGNOSIS — O0990 Supervision of high risk pregnancy, unspecified, unspecified trimester: Secondary | ICD-10-CM

## 2023-05-06 NOTE — Patient Instructions (Addendum)
Return to office for any scheduled appointments. Call the office or go to the MAU at [facility] if: You begin to have strong, frequent contractions Your water breaks.  Sometimes it is a big gush of fluid, sometimes it is just a trickle that keeps getting your underwear wet or running down your legs You have vaginal bleeding.  It is normal to have a small amount of spotting if your cervix was checked.  You do not feel your baby moving like normal.  If you do not, get something to eat and drink and lay down and focus on feeling your baby move.   If your baby is still not moving like normal, you should call the office or go to MAU. Any other obstetric concerns.    RSV Vaccination for Pregnant People  CDC recommends two ways to protect babies from getting very sick with Respiratory Syncytial Virus (RSV):  An RSV vaccination given during pregnancy  Pfizer's vaccine Verdis Frederickson) is recommended for use during pregnancy. It is given during RSV season to people who are 32 through [redacted] weeks pregnant.  Or, An RSV immunization given directly to infants and some older babies  Babies born to mothers who get RSV vaccine at least 2 weeks before delivery will have protection and, in most cases, should not need an RSV immunization later.    When is RSV season?  In most regions of the [place] RSV season starts in the fall and peaks in the winter, but the timing and severity of RSV season can vary from place to place and year to year.   The goal of maternal RSV vaccination is to protect babies from getting very sick with RSV during their first RSV season.  In most of the [place], this means maternal RSV vaccine will be given in September through January.  Who should get the maternal RSV vaccine?  People who are 63 through [redacted] weeks pregnant during September through January should get one dose of maternal RSV vaccine to protect their babies. RSV season can  vary around the country.   How is the maternal RSV vaccine administered?  Maternal RSV vaccine is given as a shot into the mother's upper arm. Only a single dose (one shot) of maternal RSV vaccine is recommended.   It is not yet known whether another dose might be needed in later pregnancies.  How well does the maternal RSV vaccine work?  When someone gets RSV vaccine, their body responds by making a protein that protects against the virus that causes RSV. The process takes about 2 weeks. When a pregnant person gets RSV vaccine, their protective proteins (called antibodies) also pass to their baby. So, babies who are born at least 2 weeks after their mother gets RSV vaccine are protected at birth, when infants are at the highest risk of severe RSV disease.   The vaccine can reduce a baby's risk of being hospitalized from RSV by 57% in the first six months after birth.  What are the possible side effects of the maternal RSV vaccine?  In the clinical trials, the side effects most often reported by pregnant people who received the maternal RSV vaccine were pain at the injection site, headache, muscle pain, and nausea.  Although not common, a dangerous high blood pressure condition called pre-eclampsia occurred in 1.8% of pregnant people who received the maternal RSV vaccine compared to 1.4% of pregnant people who received a placebo.  The clinical trials  identified a small increase in the number of preterm births in vaccinated pregnant people. It is not clear if this is a true safety problem related to RSV vaccine or if this occurred for reasons unrelated to vaccination.  To reduce the potential risk of preterm birth and complications from RSV disease, FDA approved the maternal RSV vaccine for use during weeks 32 through 36 of pregnancy while additional studies are conducted.  FDA is requiring the manufacturer to do additional studies that will look more closely at the potential risk of preterm  births and pregnancy-related high blood pressure issues in mothers, including pre-eclampsia.  Severe allergic reactions to vaccines are rare but can happen after any vaccine and can be life-threatening. If you see signs of a severe allergic reaction after vaccination (hives, swelling of the face and throat, difficulty breathing, a fast heartbeat, dizziness, or weakness), seek immediate medical care by calling 911.  As with any medicine or vaccine there is a very remote chance of the vaccine causing other serious injury or death after vaccination.  Adverse events following vaccination should be reported to the Vaccine Adverse Event Reporting System (VAERS), even if it's not clear that the vaccine caused the adverse event. You or your doctor can report an adverse event to [facility] and FDA through VAERS. If you need further assistance reporting to VAERS, please email [email or web address] [email or web address] or call [phone].  If you have any questions about side effects from the maternal RSV vaccine, talk with your healthcare provider.  Do I need a prescription for a maternal RSV vaccine?  Until the vaccine available in the office, you will need a prescription to take to a local pharmacy that is providing the vaccine.   How do I pay for the maternal RSV vaccine?  Most private health insurance plans cover the maternal RSV vaccine, but there may be a cost to you depending on your plan.  Contact your insurer to find out.  Medicaid Beginning March 09, 2022, most people with coverage from [facility] and [employer] [facility]) will be guaranteed coverage of all vaccines recommended by the Advisory Committee on Immunization Practice at no cost to them.   Source: [facility] for Immunization and Respiratory Diseases

## 2023-05-06 NOTE — Progress Notes (Signed)
PRENATAL VISIT NOTE  Subjective:  Theresa Tran is a 22 y.o. G3P2002 at [redacted]w[redacted]d being seen today for ongoing prenatal care.  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  Patient reports no complaints.  Contractions: Not present. Vag. Bleeding: None.  Movement: Present. Denies leaking of fluid.   The following portions of the patient's history were reviewed and updated as appropriate: allergies, current medications, past family history, past medical history, past social history, past surgical history and problem list.   Objective:   Vitals:   05/06/23 1621  BP: 119/72  Pulse: 72  Weight: 265 lb (120.2 kg)    Fetal Status: Fetal Heart Rate (bpm): 140   Movement: Present     General:  Alert, oriented and cooperative. Patient is in no acute distress.  Skin: Skin is warm and dry. No rash noted.   Cardiovascular: Normal heart rate noted  Respiratory: Normal respiratory effort, no problems with respiration noted  Abdomen: Soft, gravid, appropriate for gestational age.  Pain/Pressure: Absent     Pelvic: Cervical exam deferred        Extremities: Normal range of motion.  Edema: None  Mental Status: Normal mood and affect. Normal behavior. Normal judgment and thought content.   Imaging: [place] MFM OB DETAIL +14 WK  Result Date: 04/17/2023 ----------------------------------------------------------------------  OBSTETRICS REPORT                       (Signed Final 04/17/2023 10:00 am) ---------------------------------------------------------------------- Patient Info  ID #:       [id number]                          D.O.B.:  [birth year] (22 yrs)  Name:       Theresa Tran            Visit Date: 04/17/2023 07:31 am  ---------------------------------------------------------------------- Performed By  Attending:        Noralee Space MD        Ref. Address:     [address]                                                             [place], [place]                                                             [address]  Performed By:     Reinaldo Raddle            [facility]  Referred By:      [facility] ---------------------------------------------------------------------- Orders  #  Description                           Code        Ordered By  1  [place] MFM OB DETAIL +14 WK               76811.01    Jaynie Collins ----------------------------------------------------------------------  #  Order #                     Accession #                Episode #  1  [id number]                   [id number]                 [id number] ---------------------------------------------------------------------- Indications  Poor obstetric history: Previous               O09.299  preeclampsia / eclampsia/gestational HTN  Genetic carrier (Galactosemia)                 Z14.8  Obesity complicating pregnancy, second         O99.212  trimester (Pregravid BMI 47)  [redacted] weeks gestation of pregnancy                Z3A.28  Encounter for antenatal screening for          Z36.3  malformations  LR NIPS, Neg AFP ---------------------------------------------------------------------- Vital Signs  BP:          118/62 ---------------------------------------------------------------------- Fetal Evaluation  Num Of Fetuses:         1  Fetal Heart Rate(bpm):  135  Cardiac Activity:       Observed  Presentation:           Transverse, head to maternal right  Placenta:               Posterior  P. Cord Insertion:       Visualized, central  Amniotic Fluid  AFI FV:      Within normal limits  AFI Sum(cm)     %Tile       Largest Pocket(cm)  12.43           32          4.91  RUQ(cm)       RLQ(cm)       LUQ(cm)        LLQ(cm)  4.91  2.52          2.11           2.89 ---------------------------------------------------------------------- Biometry  BPD:      70.6  mm     G. Age:  28w 2d         35  %    CI:        71.21   %    70 - 86                                                          FL/HC:      19.4   %    18.8 - 20.6  HC:      266.5  mm     G. Age:  29w 0d         36  %    HC/AC:      1.10        1.05 - 1.21  AC:      241.4  mm     G. Age:  28w 3d         42  %    FL/BPD:     73.2   %    71 - 87  FL:       51.7  mm     G. Age:  27w 4d         15  %    FL/AC:      21.4   %    20 - 24  HUM:      47.1  mm     G. Age:  27w 5d         30  %  CER:      32.2  mm     G. Age:  27w 4d         29  %  LV:        5.8  mm  CM:        8.4  mm  Est. FW:    1190  gm    2 lb 10 oz      29  % ---------------------------------------------------------------------- OB History  Blood Type:   O+  Gravidity:    3         Term:   2  Living:       2 ---------------------------------------------------------------------- Gestational Age  LMP:           22w 2d        Date:  11/12/22                   EDD:   08/19/23  U/S Today:     28w 2d                                        EDD:   07/08/23  Best:          28w 3d     Det. By:  Previous Ultrasound      EDD:   07/07/23                                      (  02/26/23) ---------------------------------------------------------------------- Targeted Anatomy  Central Nervous System  Calvarium/Cranial V.:  Appears normal         Cereb./Vermis:          Appears normal  Cavum:                 Appears normal         Cisterna Magna:         Appears normal  Lateral Ventricles:    Appears normal         Midline Falx:           Appears normal  Choroid Plexus:        Appears normal  Spine  Cervical:              Limited                 Sacral:                 Limited  Thoracic:              Limited                Shape/Curvature:        Appears normal  Lumbar:                Limited  Head/Neck  Lips:                  Appears normal         Profile:                Appears normal  Neck:                  Appears normal         Orbits/Eyes:            Appears normal  Nuchal Fold:           Not well visualized    Mandible:               Not well visualized  Nasal Bone:            Appears normal         Maxilla:                Not well visualized  Thorax  4 Chamber View:        Appears normal         Interventr. Septum:     Not well visualized  Cardiac Rhythm:        Normal                 Cardiac Axis:           Normal  Cardiac Situs:         Appears normal         Diaphragm:              Appears normal  Rt Outflow Tract:      Not well visualized    3 Vessel View:          Not well visualized  Lt Outflow Tract:      Not well visualized    3 V Trachea View:       Not well visualized  Aortic Arch:           Not well visualized    IVC:  Appears normal  Ductal Arch:           Not well visualized    Crossing:               Not well visualized  SVC:                   Appears normal  Abdomen  Ventral Wall:          Not well visualized    Lt Kidney:              Appears normal  Cord Insertion:        Not well visualized    Rt Kidney:              Appears normal  Situs:                 Appears normal         Bladder:                Appears normal  Stomach:               Appears normal  Extremities  Lt Humerus:            Appears normal         Lt Femur:               Appears normal  Rt Humerus:            Appears normal         Rt Femur:               Appears normal  Lt Forearm:            Appears normal         Lt Lower Leg:           Appears normal  Rt Forearm:            Appears normal         Rt Lower Leg:           Appears normal  Lt Hand:               Open hand              Lt Foot:                Not well visualized  Rt  Hand:               Visualized             Rt Foot:                Not well visualized  Other  Umbilical Cord:        Normal 3-vessel        Genitalia:              Female-nml  Comment:     Technically difficult due to maternal habitus and fetal position. ---------------------------------------------------------------------- Cervix Uterus Adnexa  Cervix  Not visualized (advanced GA >24wks)  Uterus  No abnormality visualized.  Right Ovary  Not visualized.  Left Ovary  Not visualized.  Cul De Sac  No free fluid seen.  Adnexa  No adnexal mass visualized ---------------------------------------------------------------------- Impression  G3 P2002. Patient is here for fetal anatomy scan. On cell-free  fetal DNA screening, the risks of aneuploidies are not  increased. MSAFP screening showed low risk for open-neural  tube defects.  Obstetrical history significant for  2 term vaginal deliveries.  We performed fetal anatomy scan. No makers of  aneuploidies or fetal structural defects are seen.  Fetal  anatomical structures including cardiac anatomy could not be  evaluated because of fetal position.  Fetal biometry is  consistent with her previously-established dates. Amniotic  fluid is normal and good fetal activity is seen. Patient  understands the limitations of ultrasound in detecting fetal  anomalies.  As maternal obesity imposes limitations on the resolution of  images, fetal anomalies may be missed. ---------------------------------------------------------------------- Recommendations  -An appointment was made for her to return in 6 weeks for  fetal growth and BPP.  -Weekly BPP from [redacted] weeks gestation till delivery. ----------------------------------------------------------------------                 Noralee Space, MD Electronically Signed Final Report   04/17/2023 10:00 am ----------------------------------------------------------------------    Assessment and Plan:  Pregnancy: [id number] at [redacted]w[redacted]d 1. Maternal morbid  obesity, antepartum (HCC) TWG -13 lb  2. History of gestational hypertension Stable BP, continue ASA.  3. [redacted] weeks gestation of pregnancy 4. Supervision of high risk pregnancy, antepartum No other concerns.  Preterm labor symptoms and general obstetric precautions including but not limited to vaginal bleeding, contractions, leaking of fluid and fetal movement were reviewed in detail with the patient. Please refer to After Visit Summary for other counseling recommendations.   Return in about 20 days (around 05/26/2023) for OB visit with MD as scheduled.  Future Appointments  Date Time Provider Department Center  05/26/2023  3:55 PM Celedonio Savage, MD [facility]  05/29/2023  7:30 AM [facility] US3 [facility]  05/29/2023  9:55 AM Reva Bores, MD [facility]  06/05/2023  7:30 AM [facility] US3 [facility]  06/12/2023  7:30 AM [facility] US1 [facility]    Jaynie Collins, MD

## 2023-05-11 ENCOUNTER — Encounter: Payer: Medicaid Other | Admitting: Family Medicine

## 2023-05-21 ENCOUNTER — Other Ambulatory Visit: Payer: Self-pay | Admitting: *Deleted

## 2023-05-21 DIAGNOSIS — E7421 Galactosemia: Secondary | ICD-10-CM

## 2023-05-21 DIAGNOSIS — Z8759 Personal history of other complications of pregnancy, childbirth and the puerperium: Secondary | ICD-10-CM

## 2023-05-21 DIAGNOSIS — O99213 Obesity complicating pregnancy, third trimester: Secondary | ICD-10-CM

## 2023-05-26 ENCOUNTER — Encounter: Payer: Medicaid Other | Admitting: Family Medicine

## 2023-05-29 ENCOUNTER — Other Ambulatory Visit: Payer: Self-pay | Admitting: Obstetrics and Gynecology

## 2023-05-29 ENCOUNTER — Other Ambulatory Visit: Payer: Self-pay

## 2023-05-29 ENCOUNTER — Encounter: Payer: Medicaid Other | Admitting: Family Medicine

## 2023-05-29 ENCOUNTER — Ambulatory Visit: Payer: Medicaid Other | Attending: Obstetrics & Gynecology

## 2023-05-29 ENCOUNTER — Ambulatory Visit: Payer: Medicaid Other | Admitting: *Deleted

## 2023-05-29 DIAGNOSIS — O99210 Obesity complicating pregnancy, unspecified trimester: Secondary | ICD-10-CM | POA: Diagnosis present

## 2023-05-29 DIAGNOSIS — E669 Obesity, unspecified: Secondary | ICD-10-CM | POA: Diagnosis not present

## 2023-05-29 DIAGNOSIS — Z148 Genetic carrier of other disease: Secondary | ICD-10-CM | POA: Diagnosis not present

## 2023-05-29 DIAGNOSIS — O09293 Supervision of pregnancy with other poor reproductive or obstetric history, third trimester: Secondary | ICD-10-CM

## 2023-05-29 DIAGNOSIS — O0990 Supervision of high risk pregnancy, unspecified, unspecified trimester: Secondary | ICD-10-CM | POA: Diagnosis present

## 2023-05-29 DIAGNOSIS — Z3A34 34 weeks gestation of pregnancy: Secondary | ICD-10-CM

## 2023-05-29 DIAGNOSIS — O99213 Obesity complicating pregnancy, third trimester: Secondary | ICD-10-CM

## 2023-05-29 DIAGNOSIS — Z8759 Personal history of other complications of pregnancy, childbirth and the puerperium: Secondary | ICD-10-CM | POA: Diagnosis present

## 2023-05-29 DIAGNOSIS — O99212 Obesity complicating pregnancy, second trimester: Secondary | ICD-10-CM | POA: Diagnosis not present

## 2023-05-29 NOTE — Procedures (Signed)
Theresa Tran 2001/04/14 [redacted]w[redacted]d  Fetus A Non-Stress Test Interpretation for 05/29/23  Indication:  Obesity, Hx: gHTN  Fetal Heart Rate A Mode: External Baseline Rate (A): 120 bpm Variability: Moderate Accelerations: 15 x 15 Decelerations: None Multiple birth?: No  Uterine Activity Mode: Toco Contraction Frequency (min): None Resting Tone Palpated: Relaxed  Interpretation (Fetal Testing) Nonstress Test Interpretation: Reactive Comments: Tracing reviewed by Dr. Judeth Cornfield

## 2023-06-01 ENCOUNTER — Other Ambulatory Visit: Payer: Self-pay

## 2023-06-01 ENCOUNTER — Ambulatory Visit (INDEPENDENT_AMBULATORY_CARE_PROVIDER_SITE_OTHER): Payer: Medicaid Other | Admitting: Obstetrics and Gynecology

## 2023-06-01 VITALS — BP 133/84 | HR 89 | Wt 269.0 lb

## 2023-06-01 DIAGNOSIS — O0993 Supervision of high risk pregnancy, unspecified, third trimester: Secondary | ICD-10-CM

## 2023-06-01 DIAGNOSIS — O99213 Obesity complicating pregnancy, third trimester: Secondary | ICD-10-CM

## 2023-06-01 DIAGNOSIS — O09293 Supervision of pregnancy with other poor reproductive or obstetric history, third trimester: Secondary | ICD-10-CM

## 2023-06-01 DIAGNOSIS — Z3A34 34 weeks gestation of pregnancy: Secondary | ICD-10-CM | POA: Insufficient documentation

## 2023-06-01 DIAGNOSIS — Z8759 Personal history of other complications of pregnancy, childbirth and the puerperium: Secondary | ICD-10-CM

## 2023-06-01 DIAGNOSIS — O0990 Supervision of high risk pregnancy, unspecified, unspecified trimester: Secondary | ICD-10-CM

## 2023-06-01 NOTE — Progress Notes (Signed)
PRENATAL VISIT NOTE  Subjective:  Theresa Tran is a 22 y.o. G3P2002 at [redacted]w[redacted]d being seen today for ongoing prenatal care.  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  Patient reports no complaints.   Contractions: Not present. Vag. Bleeding: None.  Movement: Present. Denies leaking of fluid.   The following portions of the patient's history were reviewed and updated as appropriate: allergies, current medications, past family history, past medical history, past social history, past surgical history and problem list.   Objective:   Vitals:   06/01/23 1457  BP: 133/84  Pulse: 89  Weight: 269 lb (122 kg)   FH >Date 38cm Fetal Status: Fetal Heart Rate (bpm): 133   Movement: Present     General:  Alert, oriented and cooperative. Patient is in no acute distress.  Skin: Skin is warm and dry. No rash noted.   Cardiovascular: Normal heart rate noted  Respiratory: Normal respiratory effort, no problems with respiration noted  Abdomen: Soft, gravid, appropriate for gestational age.  Pain/Pressure: Present     Pelvic: Cervical exam deferred        Extremities: Normal range of motion.  Edema: None  Mental Status: Normal mood and affect. Normal behavior. Normal judgment and thought content.   Assessment and Plan:  Pregnancy: [id number] at [redacted]w[redacted]d  Theresa Tran was seen today for routine prenatal visit.  [redacted] weeks gestation of pregnancy  History of gestational hypertension Overview: For both prior pregnancies   Maternal morbid obesity, antepartum (HCC) Overview: Pregravid BMI 47.70   Supervision of high risk pregnancy, antepartum Overview:  NURSING  PROVIDER  [facility] Dating by  Ultrasound  [facility] Model Traditional Anatomy U/S Normal, BPP @34  wks for BMI per MFM  Initiated care at  14wks                Language  English              LAB RESULTS   Support Person fob Genetics NIPS: LR M AFP: neg    NT/IT (FT only)     Carrier Screen Horizon:   Rhogam  O/Positive/-- (09/19 1713) A1C/GTT Early: A1C 5.2 Third trimester: normal  Flu Vaccine  03/23/23    TDaP Vaccine  04/21/23 Blood Type O/Positive/-- (09/19 1713)  Covid Vaccine Not recieved Antibody Negative (09/19 1713)  RSV Vaccine  Rubella 1.03 (09/19 1713)  Feeding Plan bottle RPR Non Reactive (09/19 1713)  Contraception Yes , unsure HBsAg Negative (09/19 1713)  Circumcision Boy, no HIV Non Reactive (09/19 1713)  Pediatrician  Kids care ped HCVAb Non Reactive (09/19 1713)  Prenatal Classes       Pap Diagnosis  Date Value Ref Range Status  02/26/2023   Final   - Negative for intraepithelial lesion or malignancy (NILM)    BTLConsent  GC/CT Initial:  neg/neg 36wks:    VBAC  Consent  GBS   For PCN allergy, check sensitivities        DME Rx [ ]  BP cuff [ ]  Weight Scale Waterbirth  [ ]  Class [ ]  Consent [ ]  CNM visit  PHQ9 & GAD7 [  ] new OB [  ] 28 weeks  [  ] 36 weeks Induction  [ ]  Orders Entered [ ] Foley Y/N  FH>dates - [place] scheduled 12/27   Preterm labor symptoms and general obstetric precautions including but not limited to vaginal bleeding, contractions, leaking of fluid and fetal movement were reviewed in detail with the patient. Please refer to After Visit Summary for other counseling recommendations.   No follow-ups on file.  Future Appointments  Date Time Provider Department Center  06/05/2023  7:30 AM [facility] US3 [facility]  06/12/2023  7:30 AM [facility] US1 [facility]    Wyn Forster, MD FMOB Fellow, Faculty practice [facility], Center for [facility] 06/01/23  3:28 PM

## 2023-06-05 ENCOUNTER — Ambulatory Visit: Payer: Medicaid Other | Attending: Obstetrics and Gynecology

## 2023-06-08 ENCOUNTER — Other Ambulatory Visit: Payer: Medicaid Other

## 2023-06-10 NOTE — L&D Delivery Note (Addendum)
Attending OBs in OR and in another delivery. Asked by RN to come to delivery as patient was feeling urge to push and fetal vertex on perineum.  Delivery Note:   [id number] at [redacted]w[redacted]d  Admitting diagnosis: Indication for care in labor or delivery [O75.9]  Analgesia/Anesthesia intrapartum:Epidural Pushing in lithotomy position with CNM and L&D staff support  Delivery of a Live born female  Birth Weight:  pending APGAR: 9, 9  Newborn Delivery   Birth date/time: 07/05/2023 01:23:08 Delivery type: Vaginal, Spontaneous    in cephalic presentation, position OA to LOA.  APGAR:1 min-9 , 5 min-   Nuchal Cord: Yes  Cord double clamped after cessation of pulsation, cut by patient.  Collection of cord blood for typing completed. Arterial cord blood sample-No   Placenta delivered-Spontaneous with 3 vessels. Uterotonics: Pitocin Placenta to L&D Uterine tone firm  Bleeding small  None laceration identified.  Episiotomy:None  Est. Blood Loss (mL):0.00  Complications: None  Mom to postpartum. Baby boy to Couplet care / Skin to Skin.  June Leap, CNM, MSN 07/05/2023, 1:39 AM

## 2023-06-12 ENCOUNTER — Ambulatory Visit: Payer: Medicaid Other | Attending: Obstetrics and Gynecology

## 2023-06-12 ENCOUNTER — Other Ambulatory Visit: Payer: Self-pay

## 2023-06-12 ENCOUNTER — Other Ambulatory Visit: Payer: Self-pay | Admitting: *Deleted

## 2023-06-12 DIAGNOSIS — Z148 Genetic carrier of other disease: Secondary | ICD-10-CM

## 2023-06-12 DIAGNOSIS — E669 Obesity, unspecified: Secondary | ICD-10-CM

## 2023-06-12 DIAGNOSIS — O99210 Obesity complicating pregnancy, unspecified trimester: Secondary | ICD-10-CM | POA: Insufficient documentation

## 2023-06-12 DIAGNOSIS — O09293 Supervision of pregnancy with other poor reproductive or obstetric history, third trimester: Secondary | ICD-10-CM | POA: Diagnosis not present

## 2023-06-12 DIAGNOSIS — O99213 Obesity complicating pregnancy, third trimester: Secondary | ICD-10-CM

## 2023-06-12 DIAGNOSIS — Z8759 Personal history of other complications of pregnancy, childbirth and the puerperium: Secondary | ICD-10-CM | POA: Insufficient documentation

## 2023-06-12 DIAGNOSIS — O0990 Supervision of high risk pregnancy, unspecified, unspecified trimester: Secondary | ICD-10-CM | POA: Diagnosis present

## 2023-06-12 DIAGNOSIS — Z3A36 36 weeks gestation of pregnancy: Secondary | ICD-10-CM

## 2023-06-16 ENCOUNTER — Other Ambulatory Visit (HOSPITAL_COMMUNITY)
Admission: RE | Admit: 2023-06-16 | Discharge: 2023-06-16 | Disposition: A | Discharge status: Home / Self Care | Payer: Medicaid Other | Source: Ambulatory Visit | Attending: Family Medicine | Admitting: Family Medicine

## 2023-06-16 ENCOUNTER — Other Ambulatory Visit: Payer: Self-pay

## 2023-06-16 ENCOUNTER — Ambulatory Visit (INDEPENDENT_AMBULATORY_CARE_PROVIDER_SITE_OTHER): Payer: Medicaid Other | Admitting: Certified Nurse Midwife

## 2023-06-16 VITALS — BP 135/78 | HR 89 | Wt 269.7 lb

## 2023-06-16 DIAGNOSIS — Z3A37 37 weeks gestation of pregnancy: Secondary | ICD-10-CM

## 2023-06-16 DIAGNOSIS — O0990 Supervision of high risk pregnancy, unspecified, unspecified trimester: Secondary | ICD-10-CM

## 2023-06-16 DIAGNOSIS — O99213 Obesity complicating pregnancy, third trimester: Secondary | ICD-10-CM

## 2023-06-16 DIAGNOSIS — Z8759 Personal history of other complications of pregnancy, childbirth and the puerperium: Secondary | ICD-10-CM

## 2023-06-16 DIAGNOSIS — O09293 Supervision of pregnancy with other poor reproductive or obstetric history, third trimester: Secondary | ICD-10-CM

## 2023-06-16 NOTE — Progress Notes (Signed)
   PRENATAL VISIT NOTE  Subjective:  Theresa Tran is a 23 y.o. G3P2002 at [redacted]w[redacted]d being seen today for ongoing prenatal care.  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  Patient reports no complaints.  Contractions: Irritability. Vag. Bleeding: None.  Movement: Present. Denies leaking of fluid.   The following portions of the patient's history were reviewed and updated as appropriate: allergies, current medications, past family history, past medical history, past social history, past surgical history and problem list.   Objective:   Vitals:   06/16/23 1513 06/16/23 1534  BP: (!) 140/86 135/78  Pulse: 91 89  Weight: 269 lb 11.2 oz (122.3 kg)     Fetal Status: Fetal Heart Rate (bpm): 136 Fundal Height: 39 cm Movement: Present  Presentation: Vertex  General:  Alert, oriented and cooperative. Patient is in no acute distress.  Skin: Skin is warm and dry. No rash noted.   Cardiovascular: Normal heart rate noted  Respiratory: Normal respiratory effort, no problems with respiration noted  Abdomen: Soft, gravid, appropriate for gestational age.  Pain/Pressure: Present     Pelvic: Cervical exam deferred        Extremities: Normal range of motion.  Edema: None  Mental Status: Normal mood and affect. Normal behavior. Normal judgment and thought content.   Assessment and Plan:  Pregnancy: G3P2002 at [redacted]w[redacted]d 1. Supervision of high risk pregnancy, antepartum (Primary) - Doing well, feeling regular and vigorous fetal movement - GC/Chlamydia probe amp ([place])not at [facility] - Culture, beta strep (group b only)  2. [redacted] weeks gestation of pregnancy - Routine prenatal care.   3. History of gestational hypertension -  Initial BP elevated today. Repeat WNL. Of note, patient's 2 children present and playing with loud tablet.   4. Maternal morbid obesity, antepartum (HCC) - FH > dates today. Recent MFM scan reassuring. Has MFM appointment 06/19/23.   Term labor symptoms and general obstetric precautions including but not limited to vaginal bleeding, contractions, leaking of fluid and fetal movement were reviewed in detail with the patient. Please refer to After Visit Summary for other counseling recommendations.   Return in about 1 week (around 06/23/2023) for HROB.  Future Appointments  Date Time Provider Department Center  06/19/2023  7:30 AM [facility] US6 [facility]  06/26/2023  7:30 AM [facility] US5 [facility]    Camie DELENA Rote, CNM

## 2023-06-18 LAB — GC/CHLAMYDIA PROBE AMP (~~LOC~~) NOT AT ARMC
Chlamydia: NEGATIVE
Comment: NEGATIVE
Comment: NORMAL
Neisseria Gonorrhea: NEGATIVE

## 2023-06-19 ENCOUNTER — Other Ambulatory Visit: Payer: Self-pay

## 2023-06-19 ENCOUNTER — Ambulatory Visit: Payer: Medicaid Other | Attending: Obstetrics

## 2023-06-19 ENCOUNTER — Encounter: Payer: Self-pay | Admitting: Certified Nurse Midwife

## 2023-06-19 VITALS — BP 132/73

## 2023-06-19 DIAGNOSIS — O99210 Obesity complicating pregnancy, unspecified trimester: Secondary | ICD-10-CM | POA: Diagnosis present

## 2023-06-19 DIAGNOSIS — O99213 Obesity complicating pregnancy, third trimester: Secondary | ICD-10-CM | POA: Diagnosis not present

## 2023-06-19 DIAGNOSIS — Z148 Genetic carrier of other disease: Secondary | ICD-10-CM

## 2023-06-19 DIAGNOSIS — Z3A37 37 weeks gestation of pregnancy: Secondary | ICD-10-CM

## 2023-06-19 DIAGNOSIS — O0990 Supervision of high risk pregnancy, unspecified, unspecified trimester: Secondary | ICD-10-CM | POA: Insufficient documentation

## 2023-06-19 DIAGNOSIS — E669 Obesity, unspecified: Secondary | ICD-10-CM | POA: Diagnosis not present

## 2023-06-19 DIAGNOSIS — O09293 Supervision of pregnancy with other poor reproductive or obstetric history, third trimester: Secondary | ICD-10-CM | POA: Diagnosis not present

## 2023-06-19 DIAGNOSIS — Z8759 Personal history of other complications of pregnancy, childbirth and the puerperium: Secondary | ICD-10-CM | POA: Insufficient documentation

## 2023-06-19 LAB — CULTURE, BETA STREP (GROUP B ONLY): Strep Gp B Culture: POSITIVE — AB

## 2023-06-26 ENCOUNTER — Ambulatory Visit: Payer: Medicaid Other

## 2023-06-26 ENCOUNTER — Encounter: Payer: Medicaid Other | Admitting: Obstetrics & Gynecology

## 2023-06-29 ENCOUNTER — Other Ambulatory Visit: Payer: Self-pay

## 2023-06-29 ENCOUNTER — Ambulatory Visit (INDEPENDENT_AMBULATORY_CARE_PROVIDER_SITE_OTHER): Payer: Medicaid Other | Admitting: Family Medicine

## 2023-06-29 VITALS — BP 135/84 | HR 86 | Wt 269.0 lb

## 2023-06-29 DIAGNOSIS — Z3A38 38 weeks gestation of pregnancy: Secondary | ICD-10-CM

## 2023-06-29 DIAGNOSIS — O99213 Obesity complicating pregnancy, third trimester: Secondary | ICD-10-CM

## 2023-06-29 DIAGNOSIS — O0990 Supervision of high risk pregnancy, unspecified, unspecified trimester: Secondary | ICD-10-CM

## 2023-06-29 DIAGNOSIS — O0993 Supervision of high risk pregnancy, unspecified, third trimester: Secondary | ICD-10-CM

## 2023-06-29 DIAGNOSIS — Z8759 Personal history of other complications of pregnancy, childbirth and the puerperium: Secondary | ICD-10-CM

## 2023-06-29 NOTE — Progress Notes (Unsigned)
   PRENATAL VISIT NOTE  Subjective:  Theresa Tran is a 23 y.o. G3P2002 at [redacted]w[redacted]d being seen today for ongoing prenatal care.  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  Patient reports {sx:[id number]}.  Contractions: Not present. Vag. Bleeding: None.  Movement: Present. Denies leaking of fluid.   The following portions of the patient's history were reviewed and updated as appropriate: allergies, current medications, past family history, past medical history, past social history, past surgical history and problem list.   Objective:   Vitals:   06/29/23 1643  BP: 135/84  Pulse: 86  Weight: 269 lb (122 kg)    Fetal Status: Fetal Heart Rate (bpm): 135   Movement: Present     General:  Alert, oriented and cooperative. Patient is in no acute distress.  Skin: Skin is warm and dry. No rash noted.   Cardiovascular: Normal heart rate noted  Respiratory: Normal respiratory effort, no problems with respiration noted  Abdomen: Soft, gravid, appropriate for gestational age.  Pain/Pressure: Absent     Pelvic: {Blank single:19197::"Cervical exam performed in the presence of a chaperone","Cervical exam deferred"}        Extremities: Normal range of motion.  Edema: None  Mental Status: Normal mood and affect. Normal behavior. Normal judgment and thought content.   Assessment and Plan:  Pregnancy: G3P2002 at [redacted]w[redacted]d 1. Supervision of high risk pregnancy, antepartum (Primary) ***  2. History of gestational hypertension ***  3. Maternal morbid obesity, antepartum (HCC) ***  4. [redacted] weeks gestation of pregnancy ***  {Blank single:19197::"Term","Preterm"} labor symptoms and general  obstetric precautions including but not limited to vaginal bleeding, contractions, leaking of fluid and fetal movement were reviewed in detail with the patient. Please refer to After Visit Summary for other counseling recommendations.   No follow-ups on file.  No future appointments.  Celedonio Savage, MD

## 2023-07-02 NOTE — Progress Notes (Deleted)
   PRENATAL VISIT NOTE  Subjective:  Theresa Tran is a 23 y.o. G3P2002 at [redacted]w[redacted]d being seen today for ongoing prenatal care.  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  Patient reports {sx:14538}.   .  .   . Denies leaking of fluid.   The following portions of the patient's history were reviewed and updated as appropriate: allergies, current medications, past family history, past medical history, past social history, past surgical history and problem list.   Objective:  There were no vitals filed for this visit.  Fetal Status:           General:  Alert, oriented and cooperative. Patient is in no acute distress.  Skin: Skin is warm and dry. No rash noted.   Cardiovascular: Normal heart rate noted  Respiratory: Normal respiratory effort, no problems with respiration noted  Abdomen: Soft, gravid, appropriate for gestational age.        Pelvic: Cervical exam performed in the presence of a chaperone        Extremities: Normal range of motion.     Mental Status: Normal mood and affect. Normal behavior. Normal judgment and thought content.   Assessment and Plan:  Pregnancy: G3P2002 at [redacted]w[redacted]d 1. Supervision of high risk pregnancy, antepartum (Primary) - GBS pos with intrapartum tx - Stripping membranes performed today  - Induction ordered for [redacted] weeks GA  2. [redacted] weeks gestation of pregnancy - Anticipatory guidance about next visits/weeks of pregnancy given.   3. History of gestational hypertension ***  4. Maternal morbid obesity, antepartum (HCC)  Term labor symptoms and general obstetric precautions including but not limited to vaginal bleeding, contractions, leaking of  fluid and fetal movement were reviewed in detail with the patient.  Please refer to After Visit Summary for other counseling recommendations.   No follow-ups on file.  Future Appointments  Date Time Provider Department Center  07/03/2023  8:55 AM Sue Lush, FNP [facility]  07/09/2023  7:30 AM [facility] US5 [facility]    Ralene Muskrat, [place]

## 2023-07-03 ENCOUNTER — Encounter: Payer: Medicaid Other | Admitting: Obstetrics and Gynecology

## 2023-07-03 DIAGNOSIS — Z3A39 39 weeks gestation of pregnancy: Secondary | ICD-10-CM

## 2023-07-03 DIAGNOSIS — O0990 Supervision of high risk pregnancy, unspecified, unspecified trimester: Secondary | ICD-10-CM

## 2023-07-03 DIAGNOSIS — Z8759 Personal history of other complications of pregnancy, childbirth and the puerperium: Secondary | ICD-10-CM

## 2023-07-04 ENCOUNTER — Encounter (HOSPITAL_COMMUNITY): Payer: Self-pay | Admitting: Obstetrics and Gynecology

## 2023-07-04 ENCOUNTER — Inpatient Hospital Stay (HOSPITAL_COMMUNITY): Payer: Medicaid Other | Admitting: Anesthesiology

## 2023-07-04 ENCOUNTER — Inpatient Hospital Stay (HOSPITAL_COMMUNITY)
Admission: AD | Admit: 2023-07-04 | Discharge: 2023-07-06 | DRG: 807 | Disposition: A | Discharge status: Home / Self Care | Payer: Medicaid Other | Attending: Obstetrics & Gynecology | Admitting: Obstetrics & Gynecology

## 2023-07-04 ENCOUNTER — Other Ambulatory Visit: Payer: Self-pay

## 2023-07-04 DIAGNOSIS — O26893 Other specified pregnancy related conditions, third trimester: Secondary | ICD-10-CM | POA: Diagnosis present

## 2023-07-04 DIAGNOSIS — O99214 Obesity complicating childbirth: Secondary | ICD-10-CM | POA: Diagnosis present

## 2023-07-04 DIAGNOSIS — Z148 Genetic carrier of other disease: Secondary | ICD-10-CM | POA: Diagnosis not present

## 2023-07-04 DIAGNOSIS — Z8759 Personal history of other complications of pregnancy, childbirth and the puerperium: Secondary | ICD-10-CM

## 2023-07-04 DIAGNOSIS — Z3A39 39 weeks gestation of pregnancy: Secondary | ICD-10-CM

## 2023-07-04 DIAGNOSIS — O99824 Streptococcus B carrier state complicating childbirth: Secondary | ICD-10-CM | POA: Diagnosis present

## 2023-07-04 DIAGNOSIS — O0990 Supervision of high risk pregnancy, unspecified, unspecified trimester: Principal | ICD-10-CM

## 2023-07-04 DIAGNOSIS — O134 Gestational [pregnancy-induced] hypertension without significant proteinuria, complicating childbirth: Secondary | ICD-10-CM | POA: Diagnosis present

## 2023-07-04 DIAGNOSIS — Z87891 Personal history of nicotine dependence: Secondary | ICD-10-CM | POA: Diagnosis not present

## 2023-07-04 LAB — CBC
HCT: 39.7 % (ref 36.0–46.0)
HCT: 38.5 % (ref 36.0–46.0)
Hemoglobin: 13.4 g/dL (ref 12.0–15.0)
Hemoglobin: 13 g/dL (ref 12.0–15.0)
MCH: 28.3 pg (ref 26.0–34.0)
MCH: 28.3 pg (ref 26.0–34.0)
MCHC: 33.8 g/dL (ref 30.0–36.0)
MCHC: 33.8 g/dL (ref 30.0–36.0)
MCV: 83.9 fL (ref 80.0–100.0)
MCV: 83.9 fL (ref 80.0–100.0)
Platelets: 303 10*3/uL (ref 150–400)
Platelets: 314 10*3/uL (ref 150–400)
RBC: 4.73 MIL/uL (ref 3.87–5.11)
RBC: 4.59 MIL/uL (ref 3.87–5.11)
RDW: 12.9 % (ref 11.5–15.5)
RDW: 13.2 % (ref 11.5–15.5)
WBC: 11.6 10*3/uL — ABNORMAL HIGH (ref 4.0–10.5)
WBC: 12.1 10*3/uL — ABNORMAL HIGH (ref 4.0–10.5)
nRBC: 0 % (ref 0.0–0.2)
nRBC: 0 % (ref 0.0–0.2)

## 2023-07-04 LAB — COMPREHENSIVE METABOLIC PANEL
ALT: 27 U/L (ref 0–44)
AST: 29 U/L (ref 15–41)
Albumin: 2.5 g/dL — ABNORMAL LOW (ref 3.5–5.0)
Alkaline Phosphatase: 225 U/L — ABNORMAL HIGH (ref 38–126)
Anion gap: 12 (ref 5–15)
BUN: 6 mg/dL (ref 6–20)
CO2: 18 mmol/L — ABNORMAL LOW (ref 22–32)
Calcium: 9.1 mg/dL (ref 8.9–10.3)
Chloride: 105 mmol/L (ref 98–111)
Creatinine, Ser: 0.69 mg/dL (ref 0.44–1.00)
GFR, Estimated: >60 mL/min (ref >60)
Glucose, Bld: 80 mg/dL (ref 70–99)
Potassium: 3.6 mmol/L (ref 3.5–5.1)
Sodium: 135 mmol/L (ref 135–145)
Total Bilirubin: 0.8 mg/dL (ref 0.0–1.2)
Total Protein: 6.4 g/dL — ABNORMAL LOW (ref 6.5–8.1)

## 2023-07-04 LAB — TYPE AND SCREEN
ABO/RH(D): O POS
Antibody Screen: NEGATIVE

## 2023-07-04 LAB — PROTEIN / CREATININE RATIO, URINE
Creatinine, Urine: 246 mg/dL
Protein Creatinine Ratio: 0.15 mg/mg{creat} (ref 0.00–0.15)
Total Protein, Urine: 36 mg/dL

## 2023-07-04 MED ORDER — EPHEDRINE 5 MG/ML INJ
10.0000 mg | INTRAVENOUS | Status: DC | PRN
  Administered 2023-07-04: 10 mg via INTRAVENOUS

## 2023-07-04 MED ORDER — EPHEDRINE 5 MG/ML INJ
10.0000 mg | INTRAVENOUS | Status: DC | PRN
  Filled 2023-07-04 (×2): qty 5

## 2023-07-04 MED ORDER — OXYCODONE-ACETAMINOPHEN 5-325 MG PO TABS: 2.0000 | ORAL_TABLET | ORAL | Status: DC | PRN

## 2023-07-04 MED ORDER — EPHEDRINE 5 MG/ML INJ: 10.0000 mg | INTRAVENOUS | Status: DC | PRN

## 2023-07-04 MED ORDER — LACTATED RINGERS IV SOLN: INTRAVENOUS | Status: DC

## 2023-07-04 MED ORDER — FENTANYL-BUPIVACAINE-NACL 0.5-0.125-0.9 MG/250ML-% EP SOLN
12.0000 mL/h | EPIDURAL | Status: DC | PRN
  Administered 2023-07-04: 12 mL/h via EPIDURAL
  Filled 2023-07-04: qty 250

## 2023-07-04 MED ORDER — PHENYLEPHRINE 80 MCG/ML (10ML) SYRINGE FOR IV PUSH (FOR BLOOD PRESSURE SUPPORT)
80.0000 ug | PREFILLED_SYRINGE | INTRAVENOUS | Status: DC | PRN
  Administered 2023-07-04: 160 ug via INTRAVENOUS

## 2023-07-04 MED ORDER — FENTANYL CITRATE (PF) 100 MCG/2ML IJ SOLN
50.0000 ug | INTRAMUSCULAR | Status: DC | PRN
Start: 2023-07-04 — End: 2023-07-05
  Administered 2023-07-04 (×2): 100 ug via INTRAVENOUS
  Filled 2023-07-04 (×3): qty 2

## 2023-07-04 MED ORDER — ONDANSETRON HCL 4 MG/2ML IJ SOLN
4.0000 mg | Freq: Four times a day (QID) | INTRAMUSCULAR | Status: DC | PRN
Start: 2023-07-04 — End: 2023-07-05

## 2023-07-04 MED ORDER — SODIUM CHLORIDE 0.9 % IV SOLN
5.0000 10*6.[IU] | Freq: Once | INTRAVENOUS | Status: AC
  Administered 2023-07-04: 5 10*6.[IU] via INTRAVENOUS
  Filled 2023-07-04: qty 5

## 2023-07-04 MED ORDER — PENICILLIN G POT IN DEXTROSE 60000 UNIT/ML IV SOLN
3.0000 10*6.[IU] | INTRAVENOUS | Status: DC
  Administered 2023-07-04 (×3): 3 10*6.[IU] via INTRAVENOUS
  Filled 2023-07-04 (×3): qty 50

## 2023-07-04 MED ORDER — DIPHENHYDRAMINE HCL 50 MG/ML IJ SOLN: 12.5000 mg | INTRAMUSCULAR | Status: DC | PRN

## 2023-07-04 MED ORDER — ACETAMINOPHEN 325 MG PO TABS
650.0000 mg | ORAL_TABLET | ORAL | Status: DC | PRN
Start: 2023-07-04 — End: 2023-07-05

## 2023-07-04 MED ORDER — LACTATED RINGERS IV SOLN
500.0000 mL | Freq: Once | INTRAVENOUS | Status: AC
  Administered 2023-07-04: 500 mL via INTRAVENOUS

## 2023-07-04 MED ORDER — PHENYLEPHRINE 80 MCG/ML (10ML) SYRINGE FOR IV PUSH (FOR BLOOD PRESSURE SUPPORT)
80.0000 ug | PREFILLED_SYRINGE | INTRAVENOUS | Status: DC | PRN
  Filled 2023-07-04: qty 10

## 2023-07-04 MED ORDER — PHENYLEPHRINE 80 MCG/ML (10ML) SYRINGE FOR IV PUSH (FOR BLOOD PRESSURE SUPPORT)
80.0000 ug | PREFILLED_SYRINGE | INTRAVENOUS | Status: DC | PRN
  Administered 2023-07-04: 80 ug via INTRAVENOUS

## 2023-07-04 MED ORDER — OXYCODONE-ACETAMINOPHEN 5-325 MG PO TABS: 1.0000 | ORAL_TABLET | ORAL | Status: DC | PRN

## 2023-07-04 MED ORDER — LIDOCAINE HCL (PF) 1 % IJ SOLN: 30.0000 mL | INTRAMUSCULAR | Status: DC | PRN

## 2023-07-04 MED ORDER — LIDOCAINE HCL (PF) 1 % IJ SOLN
INTRAMUSCULAR | Status: DC | PRN
  Administered 2023-07-04: 8 mL via EPIDURAL

## 2023-07-04 MED ORDER — OXYTOCIN BOLUS FROM INFUSION
333.0000 mL | Freq: Once | INTRAVENOUS | Status: AC
  Administered 2023-07-05: 333 mL via INTRAVENOUS

## 2023-07-04 MED ORDER — LACTATED RINGERS IV SOLN
500.0000 mL | INTRAVENOUS | Status: DC | PRN
  Administered 2023-07-04: 500 mL via INTRAVENOUS

## 2023-07-04 MED ORDER — FENTANYL-BUPIVACAINE-NACL 0.5-0.125-0.9 MG/250ML-% EP SOLN: 12.0000 mL/h | EPIDURAL | Status: DC | PRN

## 2023-07-04 MED ORDER — SOD CITRATE-CITRIC ACID 500-334 MG/5ML PO SOLN
30.0000 mL | ORAL | Status: DC | PRN
Start: 2023-07-04 — End: 2023-07-05

## 2023-07-04 MED ORDER — OXYTOCIN-SODIUM CHLORIDE 30-0.9 UT/500ML-% IV SOLN
2.5000 [IU]/h | INTRAVENOUS | Status: DC
  Administered 2023-07-05: 2.5 [IU]/h via INTRAVENOUS
  Filled 2023-07-04: qty 500

## 2023-07-04 NOTE — MAU Note (Signed)
Theresa Tran is a 23 y.o. at [redacted]w[redacted]d here in MAU reporting: she's having ctxs that 3-4 minutes apart, started @ 0530 this morning.  Denies VB or LOF.  Endorses +FM.  LMP: NA Onset of complaint: today Pain score: 7 Vitals:   07/04/23 0857  BP: (!) 143/89  Pulse: 77  Resp: 18  Temp: 98.1 F (36.7 C)  SpO2: 98%     FHT: 126 bpm  Lab orders placed from triage: UA

## 2023-07-04 NOTE — Progress Notes (Addendum)
Theresa Tran is a 23 y.o. G3P2002 at 110w4d by ultrasound admitted for active labor.   Subjective: Patient reports feeling vaginal pressure, regular moderate-strong contractions. No more leaking of fluid since last SVE. Tolerating pain well but requesting epidural.   Objective: BP (!) 143/88   Pulse 75   Temp 98.2 F (36.8 C) (Oral)   Resp 18   Ht 5\' 4"  (1.626 m)   Wt 122.5 kg   LMP 11/12/2022   SpO2 99%   BMI 46.35 kg/m    FHT:  FHR: 120s bpm, variability: moderate,  accelerations:  Present,  decelerations:absent UC:   regular, every 2-4 minutes SVE:   Dilation: 6 Effacement (%): 80 Station: 0 Exam by:: Andrey Campanile CNM  Labs: Lab Results  Component Value Date   WBC 12.1 (H) 07/04/2023   HGB 13.0 07/04/2023   HCT 38.5 07/04/2023   MCV 83.9 07/04/2023   PLT 314 07/04/2023    Assessment / Plan: Spontaneous labor, progressing normally, Cat I FHT  Labor: Progressing normally, expectant management gHTN: Labs stable, PCR 0.15 Fetal Wellbeing:  Category I Pain Control:  Epidural I/D:  GBS +, PCN Anticipated MOD:  NSVD  Elige Ko, Student-MidWife 07/04/2023, 5:41 PM

## 2023-07-04 NOTE — Progress Notes (Addendum)
Theresa Tran is a 23 y.o. G3P2002 at [redacted]w[redacted]d by ultrasound admitted for active labor  Subjective: Patient reports regular moderate-strong contractions. She reports feeling some vaginal leaking that started 15 minutes ago but unsure if ROM. Requesting IV pain medicine.  Objective: BP (!) 128/91   Pulse 96   Temp (!) 97.5 F (36.4 C) (Oral)   Resp 19   Ht 5\' 4"  (1.626 m)   Wt 122.5 kg   LMP 11/12/2022   SpO2 99%   BMI 46.35 kg/m  No intake/output data recorded. No intake/output data recorded.  FHT:  FHR: 120 bpm, variability: moderate,  accelerations:  Present,  decelerations:  absent UC:   regular, every 2-4 minutes SVE:   Dilation: 5 Effacement (%): 80 Station: 0 Exam by:: C Idella Lamontagne CNM  Labs: Lab Results  Component Value Date   WBC 11.6 (H) 07/04/2023   HGB 13.4 07/04/2023   HCT 39.7 07/04/2023   MCV 83.9 07/04/2023   PLT 303 07/04/2023    Assessment / Plan: Spontaneous labor, progressing normally. Possible SROM. Cat I FHT  Labor: Progressing normally Preeclampsia:  labs stable and PCR pending Fetal Wellbeing:  Category I Pain Control:  IV pain meds I/D:  n/a Anticipated MOD:  NSVD  Elige Ko, Student-MidWife 07/04/2023, 3:07 PM

## 2023-07-04 NOTE — Anesthesia Procedure Notes (Signed)
Epidural Patient location during procedure: OB Start time: 07/04/2023 6:10 PM End time: 07/04/2023 6:15 PM  Staffing Anesthesiologist: Bethena Midget, MD  Preanesthetic Checklist Completed: patient identified, IV checked, site marked, risks and benefits discussed, surgical consent, monitors and equipment checked, pre-op evaluation and timeout performed  Epidural Patient position: sitting Prep: DuraPrep and site prepped and draped Patient monitoring: continuous pulse ox and blood pressure Approach: midline Location: L3-L4 Injection technique: LOR air  Needle:  Needle type: Tuohy  Needle gauge: 17 G Needle length: 9 cm and 9 Needle insertion depth: 9 cm Catheter type: closed end flexible Catheter size: 19 Gauge Catheter at skin depth: 15 cm Test dose: negative  Assessment Events: blood not aspirated, no cerebrospinal fluid, injection not painful, no injection resistance, no paresthesia and negative IV test

## 2023-07-04 NOTE — Progress Notes (Signed)
Pt walking in room and on birthing ball

## 2023-07-04 NOTE — H&P (Signed)
OBSTETRIC ADMISSION HISTORY AND PHYSICAL  Theresa Tran is a 23 y.o. female G3P2002 with IUP at [redacted]w[redacted]d by [place] presenting for contractions that started at 0545. Blood pressure elevated on arrival to MAU. She reports +FMs, No LOF, no VB, no blurry vision, headaches or peripheral edema, and RUQ pain. She plans on bottle feeding. She is undecided on contraception but is considering pills vs IUD. She received her prenatal [facility]   Dating: By 21wk [place] --->  Estimated Date of Delivery: 07/07/23  Sono:    @[redacted]w[redacted]d , CWD, normal anatomy, cephalic presentation, posterior placenta , 2670g, 74% EFW   Prenatal History/Complications: History of gestational hypertension with both previous pregnancies.   NURSING  PROVIDER  Conservator, museum/gallery for Women Dating by Ultrasound  [facility] Model Traditional Anatomy U/S Normal, BPP @34  wks for BMI per MFM  Initiated care at  14wks                Language  English              LAB RESULTS   Support Person fob Genetics NIPS: LR M AFP: neg    NT/IT (FT only)     Carrier Screen Horizon:   Rhogam  O/Positive/-- (09/19 1713) A1C/GTT Early: A1C 5.2 Third trimester: normal  Flu Vaccine  03/23/23    TDaP Vaccine  04/21/23 Blood Type O/Positive/-- (09/19 1713)  Covid Vaccine Not recieved Antibody Negative (09/19 1713)  RSV Vaccine  Rubella 1.03 (09/19 1713)  Feeding Plan bottle RPR Non Reactive (09/19 1713)  Contraception Yes , unsure HBsAg Negative (09/19 1713)  Circumcision Boy, no HIV Non Reactive (09/19 1713)  Pediatrician  Kids care ped HCVAb Non Reactive (09/19 1713)  Prenatal Classes       Pap Diagnosis  Date Value Ref Range Status  02/26/2023   Final   - Negative for intraepithelial lesion or malignancy (NILM)    BTLConsent  GC/CT Initial:  neg/neg 36wks:    VBAC  Consent  GBS   For PCN allergy, check sensitivities        DME Rx [ ]  BP cuff [ ]  Weight Scale Waterbirth  [ ]  Class [ ]  Consent [ ]  CNM visit  PHQ9 & GAD7 [  ] new OB [  ] 28  weeks  [  ] 36 weeks Induction  [ ]  Orders Entered [ ] Foley Y/N    Past Medical History: Past Medical History:  Diagnosis Date   Gestational hypertension    Both both of her pregnancies    Past Surgical History: Past Surgical History:  Procedure Laterality Date   NO PAST SURGERIES      Obstetrical History: OB History     Gravida  3   Para  2   Term  2   Preterm  0   AB  0   Living  2      SAB  0   IAB  0   Ectopic  0   Multiple  0   Live Births  2           Social History Social History   Socioeconomic History   Marital status: Single    Spouse name: Not on file   Number of children: Not on file   Years of education: Not on file   Highest education level: Not on file  Occupational History   Not on file  Tobacco Use   Smoking status: Former    Current packs/day: 0.00  Types: Cigarettes    Quit date: 07/10/2016    Years since quitting: 6.9   Smokeless tobacco: Never  Vaping Use   Vaping status: Never Used  Substance and Sexual Activity   Alcohol use: No   Drug use: No   Sexual activity: Yes    Birth control/protection: None  Other Topics Concern   Not on file  Social History Narrative   Not on file   Social Drivers of Health   Financial Resource Strain: Not on file  Food Insecurity: No Food Insecurity (06/22/2020)   Hunger Vital Sign    Worried About Running Out of Food in the Last Year: Never true    Ran Out of Food in the Last Year: Never true  Transportation Needs: No Transportation Needs (06/22/2020)   PRAPARE - Administrator, Civil Service (Medical): No    Lack of Transportation (Non-Medical): No  Physical Activity: Not on file  Stress: Not on file  Social Connections: Not on file    Family History: Family History  Problem Relation Age of Onset   Diabetes Neg Hx    Hyperlipidemia Neg Hx    Hypertension Neg Hx     Allergies: No Known Allergies  Medications Prior to Admission  Medication Sig Dispense  Refill Last Dose/Taking   aspirin EC 81 MG tablet Take 1 tablet (81 mg total) by mouth at bedtime. Start taking when you are [redacted] weeks pregnant for rest of pregnancy for prevention of preeclampsia 300 tablet 2 07/04/2023 Morning   Prenatal 28-0.8 MG TABS Take 1 tablet by mouth daily. 30 tablet 12 07/04/2023 Morning   acetaminophen (TYLENOL) 325 MG tablet Take 2 tablets (650 mg total) by mouth every 4 (four) hours as needed (for pain scale < 4).        Review of Systems   All systems reviewed and negative except as stated in HPI  Blood pressure (!) 141/90, pulse 86, temperature 98.1 F (36.7 C), temperature source Oral, resp. rate 18, height 5\' 4"  (1.626 m), weight 122.6 kg, last menstrual period 11/12/2022, SpO2 99%, unknown if currently breastfeeding. General appearance: alert and no distress Lungs: clear to auscultation bilaterally Heart: regular rate and rhythm Abdomen: soft, non-tender; bowel sounds normal Pelvic: deferred Extremities: no sign of DVT Presentation: cephalic Fetal monitoring Baseline: 120s bpm, Variability: Good {> 6 bpm), Accelerations: Reactive, and Decelerations: Absent Uterine activity Date/time of onset: 07/04/23 @0545 , Frequency: Every 1-4 minutes, Duration: 50-120 seconds, and Intensity: mild-moderate Dilation: 2 Effacement (%): 70 Station: -3 Exam by:: Zenia Resides, RN   Prenatal labs: ABO, Rh: O/Positive/-- (09/19 1713) Antibody: Negative (09/19 1713) Rubella: 1.03 (09/19 1713) RPR: Non Reactive (11/12 0826)  HBsAg: Negative (09/19 1713)  HIV: Non Reactive (11/12 0826)  GBS: Positive/-- (01/07 1708)    Lab Results  Component Value Date   GBS Positive (A) 06/16/2023    Immunization History  Administered Date(s) Administered   Influenza, Seasonal, Injecte, Preservative Fre 03/23/2023   Tdap 04/18/2020, 04/21/2023    Prenatal Transfer Tool  Maternal Diabetes: No Genetic Screening: Normal Maternal Ultrasounds/Referrals: Normal Fetal Ultrasounds  or other Referrals:  None Maternal Substance Abuse:  No Significant Maternal Medications: Significant Maternal Lab Results: Group B Strep positive Number of Prenatal Visits:greater than 3 verified prenatal visits Maternal Vaccinations:TDap and Flu    No results found for this or any previous visit (from the past 24 hours).  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 G3P2002 at [redacted]w[redacted]d here for contractions and gHTN.   #Labor: expectant management #Pain: IV pain meds/epidural upon request #FWB: Cat 1 tracing #gHTN: Labs pending #GBS status:  Positive, PCN #Feeding: Formula #Reproductive Life planning: Undecided #Circ:  no  Elige Ko, Student-MidWife  07/04/2023, 9:25 AM

## 2023-07-04 NOTE — Progress Notes (Signed)
Questionably rupture of membranes

## 2023-07-04 NOTE — Anesthesia Preprocedure Evaluation (Signed)
Anesthesia Evaluation  Patient identified by MRN, date of birth, ID band Patient awake    Reviewed: Allergy & Precautions, H&P , NPO status , Patient's Chart, lab work & pertinent test results, reviewed documented beta blocker date and time   Airway Mallampati: II  TM Distance: >3 FB Neck ROM: full    Dental no notable dental hx. (+) Teeth Intact, Dental Advisory Given   Pulmonary neg pulmonary ROS, former smoker   Pulmonary exam normal breath sounds clear to auscultation       Cardiovascular hypertension, Pt. on medications negative cardio ROS Normal cardiovascular exam Rhythm:regular Rate:Normal     Neuro/Psych negative neurological ROS  negative psych ROS   GI/Hepatic negative GI ROS, Neg liver ROS,,,  Endo/Other  negative endocrine ROS    Renal/GU negative Renal ROS  negative genitourinary   Musculoskeletal   Abdominal   Peds  Hematology negative hematology ROS (+)   Anesthesia Other Findings   Reproductive/Obstetrics (+) Pregnancy                             Anesthesia Physical Anesthesia Plan  ASA: 3  Anesthesia Plan: Epidural   Post-op Pain Management: Minimal or no pain anticipated   Induction: Intravenous  PONV Risk Score and Plan: 2  Airway Management Planned: Natural Airway and Simple Face Mask  Additional Equipment: None  Intra-op Plan:   Post-operative Plan:   Informed Consent: I have reviewed the patients History and Physical, chart, labs and discussed the procedure including the risks, benefits and alternatives for the proposed anesthesia with the patient or authorized representative who has indicated his/her understanding and acceptance.       Plan Discussed with: Anesthesiologist  Anesthesia Plan Comments:        Anesthesia Quick Evaluation

## 2023-07-05 ENCOUNTER — Encounter (HOSPITAL_COMMUNITY): Payer: Self-pay | Admitting: Obstetrics and Gynecology

## 2023-07-05 LAB — RPR: RPR Ser Ql: NONREACTIVE

## 2023-07-05 LAB — CBC
HCT: 36.9 % (ref 36.0–46.0)
Hemoglobin: 12.6 g/dL (ref 12.0–15.0)
MCH: 28.6 pg (ref 26.0–34.0)
MCHC: 34.1 g/dL (ref 30.0–36.0)
MCV: 83.7 fL (ref 80.0–100.0)
Platelets: 326 10*3/uL (ref 150–400)
RBC: 4.41 MIL/uL (ref 3.87–5.11)
RDW: 13.2 % (ref 11.5–15.5)
WBC: 19.1 10*3/uL — ABNORMAL HIGH (ref 4.0–10.5)
nRBC: 0 % (ref 0.0–0.2)

## 2023-07-05 MED ORDER — ONDANSETRON HCL 4 MG/2ML IJ SOLN: 4.0000 mg | INTRAMUSCULAR | Status: DC | PRN

## 2023-07-05 MED ORDER — DOCUSATE SODIUM 100 MG PO CAPS
100.0000 mg | ORAL_CAPSULE | Freq: Two times a day (BID) | ORAL | Status: DC
  Administered 2023-07-05: 100 mg via ORAL
  Filled 2023-07-05: qty 1

## 2023-07-05 MED ORDER — DIBUCAINE (PERIANAL) 1 % EX OINT: 1.0000 | TOPICAL_OINTMENT | CUTANEOUS | Status: DC | PRN

## 2023-07-05 MED ORDER — COCONUT OIL OIL: 1.0000 | TOPICAL_OIL | Status: DC | PRN

## 2023-07-05 MED ORDER — ONDANSETRON HCL 4 MG PO TABS: 4.0000 mg | ORAL_TABLET | ORAL | Status: DC | PRN

## 2023-07-05 MED ORDER — ZOLPIDEM TARTRATE 5 MG PO TABS: 5.0000 mg | ORAL_TABLET | Freq: Every evening | ORAL | Status: DC | PRN

## 2023-07-05 MED ORDER — MEASLES, MUMPS & RUBELLA VAC IJ SOLR
0.5000 mL | Freq: Once | INTRAMUSCULAR | Status: DC
Start: 2023-07-06 — End: 2023-07-06

## 2023-07-05 MED ORDER — OXYCODONE HCL 5 MG PO TABS: 5.0000 mg | ORAL_TABLET | ORAL | Status: DC | PRN

## 2023-07-05 MED ORDER — WITCH HAZEL-GLYCERIN EX PADS: 1.0000 | MEDICATED_PAD | CUTANEOUS | Status: DC | PRN

## 2023-07-05 MED ORDER — LACTATED RINGERS IV SOLN: INTRAVENOUS | Status: AC

## 2023-07-05 MED ORDER — TETANUS-DIPHTH-ACELL PERTUSSIS 5-2.5-18.5 LF-MCG/0.5 IM SUSY: 0.5000 mL | PREFILLED_SYRINGE | Freq: Once | INTRAMUSCULAR | Status: DC

## 2023-07-05 MED ORDER — SENNOSIDES-DOCUSATE SODIUM 8.6-50 MG PO TABS
2.0000 | ORAL_TABLET | ORAL | Status: DC
  Administered 2023-07-05 – 2023-07-06 (×2): 2 via ORAL
  Filled 2023-07-05 (×2): qty 2

## 2023-07-05 MED ORDER — ACETAMINOPHEN 325 MG PO TABS: 650.0000 mg | ORAL_TABLET | ORAL | Status: DC | PRN

## 2023-07-05 MED ORDER — IBUPROFEN 600 MG PO TABS
600.0000 mg | ORAL_TABLET | Freq: Four times a day (QID) | ORAL | Status: DC
  Administered 2023-07-05 – 2023-07-06 (×6): 600 mg via ORAL
  Filled 2023-07-05 (×6): qty 1

## 2023-07-05 MED ORDER — SIMETHICONE 80 MG PO CHEW
80.0000 mg | CHEWABLE_TABLET | ORAL | Status: DC | PRN
  Filled 2023-07-05: qty 1

## 2023-07-05 MED ORDER — PRENATAL MULTIVITAMIN CH
1.0000 | ORAL_TABLET | Freq: Every day | ORAL | Status: DC
  Administered 2023-07-05 – 2023-07-06 (×2): 1 via ORAL
  Filled 2023-07-05 (×2): qty 1

## 2023-07-05 MED ORDER — DIPHENHYDRAMINE HCL 50 MG/ML IJ SOLN
25.0000 mg | Freq: Once | INTRAMUSCULAR | Status: AC
Start: 2023-07-05 — End: 2023-07-05
  Administered 2023-07-05: 25 mg via INTRAVENOUS
  Filled 2023-07-05: qty 1

## 2023-07-05 MED ORDER — OXYCODONE HCL 5 MG PO TABS: 10.0000 mg | ORAL_TABLET | ORAL | Status: DC | PRN

## 2023-07-05 MED ORDER — DIPHENHYDRAMINE HCL 25 MG PO CAPS: 25.0000 mg | ORAL_CAPSULE | Freq: Four times a day (QID) | ORAL | Status: DC | PRN

## 2023-07-05 MED ORDER — BENZOCAINE-MENTHOL 20-0.5 % EX AERO
1.0000 | INHALATION_SPRAY | CUTANEOUS | Status: DC | PRN
  Administered 2023-07-05: 1 via TOPICAL
  Filled 2023-07-05: qty 56

## 2023-07-05 NOTE — Discharge Summary (Shared)
Physician Obstetric Discharge Summary  Patient ID: Theresa Tran MRN: [id number] DOB/AGE: 2001/01/31 23 y.o.   Date of Admission: 07/04/2023  Date of Discharge:   Admitting Diagnosis: Onset of Labor at [redacted]w[redacted]d  Secondary Diagnosis: Gestational hypertension  Mode of Delivery: normal spontaneous vaginal delivery     Discharge Diagnosis: {OBGYN discharge diagnosis:23532}   Intrapartum Procedures: Atificial rupture of membranes, epidural, and GBS prophylaxis   Post partum procedures: {Postpartum procedures:23558}  Complications: {complications:3041412}   Brief Hospital Course  Theresa Tran is a [id number] who had a SVD on 07/05/23;  for further details of this delivery, please refer to the delivery note.  Patient had an uncomplicated postpartum course.  By time of discharge on PPD#2, her pain was controlled on oral pain medications; she had appropriate lochia and was ambulating, voiding without difficulty and tolerating regular diet.  She was deemed stable for discharge to home.    Labs:    Latest Ref Rng & Units 07/04/2023    4:13 PM 07/04/2023    9:35 AM 04/21/2023    8:26 AM  CBC  WBC 4.0 - 10.5 K/uL 12.1  11.6  11.2   Hemoglobin 12.0 - 15.0 g/dL 14.7  82.9  56.2   Hematocrit 36.0 - 46.0 % 38.5  39.7  37.7   Platelets 150 - 400 K/uL 314  303  304     --/--/O POS (01/25 0935)  Physical exam:  Blood pressure 123/78, pulse 81, temperature 99 F (37.2 C), temperature source Oral, resp. rate 17, height 5\' 4"  (1.626 m), weight 122.5 kg, last menstrual period 11/12/2022, SpO2 96%, unknown if currently breastfeeding. CONSTITUTIONAL: Well-developed, well-nourished female in no acute distress.   HENT:  Normocephalic, atraumatic, External right and left ear normal. Oropharynx is clear and moist EYES: Conjunctivae and EOM are normal. Pupils are equal, round, and reactive to light. No scleral icterus.   NECK: Normal range of motion, supple, no masses.  Normal thyroid.    SKIN: Skin is warm and dry. No rash noted. Not diaphoretic. No erythema. No pallor. NEUROLGIC: Alert and oriented to person, place, and time. Normal reflexes, muscle tone coordination. No cranial nerve deficit noted. PSYCHIATRIC: Normal mood and affect. Normal behavior. Normal judgment and thought content. CARDIOVASCULAR: Normal heart rate noted RESPIRATORY:  Effort and breath sounds normal, no problems with respiration noted. ABDOMEN: Soft, no distention noted.   PELVIC: Uterine fundus is firm,nontender, appropriate lochia MUSCULOSKELETAL: Normal range of motion. No tenderness.  No cyanosis, clubbing, or edema.  2+ distal pulses. No evidence of DVT seen on physical exam.  Discharge Instructions: Per After Visit Summary. Activity: Advance as tolerated. Pelvic rest for 6 weeks.  Also refer to After Visit Summary Diet: Regular Medications: Allergies as of 07/05/2023   No Known Allergies   Med Rec must be completed prior to using this SMARTLINK***      Postpartum contraception: {PLAN CONTRACEPTION:313102}  Discharged Condition: Stable Discharged to: Home Outpatient follow up:   Newborn Data: APGAR (1 MIN): 9  APGAR (5 MINS):   APGAR (10 MINS):   Baby Feeding: Bottle Disposition:{CHL IP OB HOME WITH ZHYQMV:78469}  Cyndia Skeeters, DO [facility], PGY-1 07/05/23 1:47 AM

## 2023-07-05 NOTE — Progress Notes (Signed)
Labor Progress Note  Theresa Tran is a 23 y.o. G3P2002 at [redacted]w[redacted]d presented for SOL in s/o newly dx gHTN  S: pt resting comfortably in bed, practice pushing with nurse.   O:  BP (!) 109/51   Pulse (!) 103   Temp 99 F (37.2 C) (Oral)   Resp 17   Ht 5\' 4"  (1.626 m)   Wt 122.5 kg   LMP 11/12/2022   SpO2 99%   BMI 46.35 kg/m  EFM:135bpm/Moderate variability/ 15x15 accels/ Early decels CAT: 1 Toco: regular, every 3 minutes   CVE: Dilation: Lip/rim Dilation Complete Date: 07/04/23 Dilation Complete Time: 2341 Effacement (%): 90 Cervical Position: Middle Station: Plus 1, 0 Presentation: Vertex Exam by:: Dr. Judd Lien   A&P: 23 y.o. [id number] [redacted]w[redacted]d  here for AOL in s/o new dx gHTN as above  #Labor: AROM'ed membrane with meconium stained fluid.  Noticed anterior lip.  Tried reducing on next contraction and was unsuccessful.  Will give benadryl x1 and expectant manage.  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

## 2023-07-05 NOTE — Anesthesia Postprocedure Evaluation (Signed)
Anesthesia Post Note  Patient: Theresa Tran  Procedure(s) Performed: AN AD HOC LABOR EPIDURAL     Patient location during evaluation: Mother Baby Anesthesia Type: Epidural Level of consciousness: awake and alert Pain management: pain level controlled Vital Signs Assessment: post-procedure vital signs reviewed and stable Respiratory status: spontaneous breathing, nonlabored ventilation and respiratory function stable Cardiovascular status: stable Postop Assessment: no headache, no backache, epidural receding, no apparent nausea or vomiting, patient able to bend at knees, able to ambulate and adequate PO intake Anesthetic complications: no   No notable events documented.  Last Vitals:  Vitals:   07/05/23 0345 07/05/23 0440  BP: 136/71 113/61  Pulse: 91 84  Resp: 18 18  Temp: 37.2 C 37.2 C  SpO2: 100% 100%    Last Pain:  Vitals:   07/05/23 0440  TempSrc: Oral  PainSc: 4    Pain Goal: Patients Stated Pain Goal: 0 (07/04/23 2100)                 Laban Emperor

## 2023-07-05 NOTE — Plan of Care (Signed)
  Problem: Education: Goal: Knowledge of General Education information will improve Description: Including pain rating scale, medication(s)/side effects and non-pharmacologic comfort measures Outcome: Completed/Met   Problem: Health Behavior/Discharge Planning: Goal: Ability to manage health-related needs will improve Outcome: Completed/Met   Problem: Clinical Measurements: Goal: Ability to maintain clinical measurements within normal limits will improve Outcome: Completed/Met Goal: Will remain free from infection Outcome: Completed/Met Goal: Diagnostic test results will improve Outcome: Completed/Met Goal: Respiratory complications will improve Outcome: Completed/Met Goal: Cardiovascular complication will be avoided Outcome: Completed/Met   Problem: Activity: Goal: Risk for activity intolerance will decrease Outcome: Completed/Met   Problem: Nutrition: Goal: Adequate nutrition will be maintained Outcome: Completed/Met   Problem: Coping: Goal: Level of anxiety will decrease Outcome: Completed/Met   Problem: Elimination: Goal: Will not experience complications related to bowel motility Outcome: Completed/Met Goal: Will not experience complications related to urinary retention Outcome: Completed/Met   Problem: Pain Managment: Goal: General experience of comfort will improve and/or be controlled Outcome: Completed/Met   Problem: Safety: Goal: Ability to remain free from injury will improve Outcome: Completed/Met   Problem: Skin Integrity: Goal: Risk for impaired skin integrity will decrease Outcome: Completed/Met   Problem: Education: Goal: Knowledge of Childbirth will improve Outcome: Completed/Met Goal: Ability to make informed decisions regarding treatment and plan of care will improve Outcome: Completed/Met Goal: Ability to state and carry out methods to decrease the pain will improve Outcome: Completed/Met   Problem: Coping: Goal: Ability to verbalize  concerns and feelings about labor and delivery will improve Outcome: Completed/Met   Problem: Life Cycle: Goal: Ability to make normal progression through stages of labor will improve Outcome: Completed/Met Goal: Ability to effectively push during vaginal delivery will improve Outcome: Completed/Met   Problem: Role Relationship: Goal: Will demonstrate positive interactions with the child Outcome: Completed/Met   Problem: Safety: Goal: Risk of complications during labor and delivery will decrease Outcome: Completed/Met   Problem: Pain Management: Goal: Relief or control of pain from uterine contractions will improve Outcome: Completed/Met

## 2023-07-05 NOTE — Progress Notes (Signed)
Grateful to Dorisann Frames, CNM for her help in delivering our patient (we were in another room doing a delivery). Checked on patient, she and baby are stable, no immediate postpartum concerns. Routine postpartum orders placed.   Jaynie Collins, MD, FACOG Obstetrician & Gynecologist, [facility] for [employer], [facility]

## 2023-07-06 ENCOUNTER — Other Ambulatory Visit (HOSPITAL_COMMUNITY): Payer: Self-pay

## 2023-07-06 MED ORDER — NORETHINDRONE 0.35 MG PO TABS
1.0000 | ORAL_TABLET | Freq: Every day | ORAL | 5 refills | Status: AC
  Filled 2023-07-06: qty 28, 28d supply, fill #0
  Filled 2023-08-16: qty 28, 28d supply, fill #1

## 2023-07-06 MED ORDER — SENNOSIDES-DOCUSATE SODIUM 8.6-50 MG PO TABS
2.0000 | ORAL_TABLET | Freq: Two times a day (BID) | ORAL | 0 refills | Status: AC | PRN
  Filled 2023-07-06: qty 60, 15d supply, fill #0

## 2023-07-06 MED ORDER — POTASSIUM CHLORIDE 20 MEQ PO PACK
20.0000 meq | PACK | Freq: Every day | ORAL | 0 refills | Status: AC
  Filled 2023-07-06: qty 5, 5d supply, fill #0

## 2023-07-06 MED ORDER — IBUPROFEN 600 MG PO TABS
600.0000 mg | ORAL_TABLET | Freq: Four times a day (QID) | ORAL | 0 refills | Status: AC
  Filled 2023-07-06: qty 30, 8d supply, fill #0

## 2023-07-06 MED ORDER — FUROSEMIDE 20 MG PO TABS
20.0000 mg | ORAL_TABLET | Freq: Every day | ORAL | 0 refills | Status: AC
Start: 2023-07-06 — End: 2024-07-05
  Filled 2023-07-06: qty 7, 7d supply, fill #0

## 2023-07-09 ENCOUNTER — Ambulatory Visit: Payer: Medicaid Other

## 2023-07-13 ENCOUNTER — Telehealth (HOSPITAL_COMMUNITY): Payer: Self-pay

## 2023-07-13 NOTE — Telephone Encounter (Signed)
07/13/2023 1207  Name: Theresa Tran MRN: [id number] DOB: 09/12/2000  Reason for Call:  Transition of Care Hospital Discharge Call  Contact Status: Patient Contact Status: Message  Language assistant needed:          Follow-Up Questions:    Inocente Salles Postnatal Depression Scale:  In the Past 7 Days:    PHQ2-9 Depression Scale:     Discharge Follow-up:    Post-discharge interventions: NA  Signature  Signe Colt

## 2023-08-11 ENCOUNTER — Ambulatory Visit: Payer: Medicaid Other | Admitting: Obstetrics and Gynecology

## 2023-08-16 ENCOUNTER — Other Ambulatory Visit: Payer: Self-pay | Admitting: Family Medicine

## 2023-08-17 ENCOUNTER — Other Ambulatory Visit: Payer: Self-pay

## 2023-08-18 ENCOUNTER — Other Ambulatory Visit (HOSPITAL_BASED_OUTPATIENT_CLINIC_OR_DEPARTMENT_OTHER): Payer: Self-pay

## 2023-08-19 ENCOUNTER — Inpatient Hospital Stay (HOSPITAL_COMMUNITY): Admit: 2023-08-19 | Payer: Medicaid Other

## 2023-08-22 ENCOUNTER — Other Ambulatory Visit: Payer: Self-pay | Admitting: Family Medicine

## 2023-08-24 ENCOUNTER — Other Ambulatory Visit: Payer: Self-pay

## 2023-08-27 ENCOUNTER — Other Ambulatory Visit (HOSPITAL_BASED_OUTPATIENT_CLINIC_OR_DEPARTMENT_OTHER): Payer: Self-pay
